# Patient Record
Sex: Female | Born: 1935 | Race: White | Hispanic: No | Smoking: Never smoker
Health system: Southern US, Community
[De-identification: ages and names within clinical notes are randomized; demographics above are authoritative.]

## PROBLEM LIST (undated history)

## (undated) DIAGNOSIS — E876 Hypokalemia: Secondary | ICD-10-CM

## (undated) DIAGNOSIS — E0590 Thyrotoxicosis, unspecified without thyrotoxic crisis or storm: Secondary | ICD-10-CM

## (undated) DIAGNOSIS — M1990 Unspecified osteoarthritis, unspecified site: Secondary | ICD-10-CM

## (undated) DIAGNOSIS — I10 Essential (primary) hypertension: Secondary | ICD-10-CM

## (undated) DIAGNOSIS — D649 Anemia, unspecified: Secondary | ICD-10-CM

## (undated) DIAGNOSIS — D691 Qualitative platelet defects: Secondary | ICD-10-CM

## (undated) DIAGNOSIS — N200 Calculus of kidney: Secondary | ICD-10-CM

## (undated) DIAGNOSIS — J90 Pleural effusion, not elsewhere classified: Secondary | ICD-10-CM

## (undated) DIAGNOSIS — K5790 Diverticulosis of intestine, part unspecified, without perforation or abscess without bleeding: Secondary | ICD-10-CM

## (undated) DIAGNOSIS — I4891 Unspecified atrial fibrillation: Secondary | ICD-10-CM

## (undated) DIAGNOSIS — E119 Type 2 diabetes mellitus without complications: Secondary | ICD-10-CM

## (undated) DIAGNOSIS — I509 Heart failure, unspecified: Secondary | ICD-10-CM

## (undated) HISTORY — PX: CHOLECYSTECTOMY: SHX55

## (undated) HISTORY — DX: Unspecified osteoarthritis, unspecified site: M19.90

## (undated) HISTORY — DX: Calculus of kidney: N20.0

## (undated) HISTORY — DX: Diverticulosis of intestine, part unspecified, without perforation or abscess without bleeding: K57.90

## (undated) HISTORY — DX: Hypokalemia: E87.6

## (undated) HISTORY — DX: Unspecified atrial fibrillation: I48.91

## (undated) HISTORY — DX: Type 2 diabetes mellitus without complications: E11.9

## (undated) HISTORY — DX: Essential (primary) hypertension: I10

## (undated) HISTORY — DX: Qualitative platelet defects: D69.1

## (undated) HISTORY — DX: Anemia, unspecified: D64.9

## (undated) HISTORY — DX: Heart failure, unspecified: I50.9

## (undated) HISTORY — DX: Pleural effusion, not elsewhere classified: J90

## (undated) HISTORY — DX: Thyrotoxicosis, unspecified without thyrotoxic crisis or storm: E05.90

---

## 2000-03-23 ENCOUNTER — Encounter: Payer: Self-pay | Admitting: Emergency Medicine

## 2000-03-23 ENCOUNTER — Inpatient Hospital Stay (HOSPITAL_COMMUNITY): Admission: EM | Admit: 2000-03-23 | Discharge: 2000-03-24 | Payer: Self-pay | Admitting: Emergency Medicine

## 2011-03-01 DIAGNOSIS — E0520 Thyrotoxicosis with toxic multinodular goiter without thyrotoxic crisis or storm: Secondary | ICD-10-CM | POA: Diagnosis not present

## 2011-03-02 DIAGNOSIS — G4700 Insomnia, unspecified: Secondary | ICD-10-CM | POA: Diagnosis not present

## 2011-03-02 DIAGNOSIS — M949 Disorder of cartilage, unspecified: Secondary | ICD-10-CM | POA: Diagnosis not present

## 2011-03-02 DIAGNOSIS — E119 Type 2 diabetes mellitus without complications: Secondary | ICD-10-CM | POA: Diagnosis not present

## 2011-03-02 DIAGNOSIS — J3089 Other allergic rhinitis: Secondary | ICD-10-CM | POA: Diagnosis not present

## 2011-03-02 DIAGNOSIS — I10 Essential (primary) hypertension: Secondary | ICD-10-CM | POA: Diagnosis not present

## 2011-03-02 DIAGNOSIS — M899 Disorder of bone, unspecified: Secondary | ICD-10-CM | POA: Diagnosis not present

## 2011-03-09 DIAGNOSIS — E119 Type 2 diabetes mellitus without complications: Secondary | ICD-10-CM | POA: Diagnosis not present

## 2011-03-09 DIAGNOSIS — E7800 Pure hypercholesterolemia, unspecified: Secondary | ICD-10-CM | POA: Diagnosis not present

## 2011-03-09 DIAGNOSIS — M949 Disorder of cartilage, unspecified: Secondary | ICD-10-CM | POA: Diagnosis not present

## 2011-03-09 DIAGNOSIS — M899 Disorder of bone, unspecified: Secondary | ICD-10-CM | POA: Diagnosis not present

## 2011-03-15 DIAGNOSIS — E119 Type 2 diabetes mellitus without complications: Secondary | ICD-10-CM | POA: Diagnosis not present

## 2011-03-15 DIAGNOSIS — E7800 Pure hypercholesterolemia, unspecified: Secondary | ICD-10-CM | POA: Diagnosis not present

## 2011-03-15 DIAGNOSIS — I10 Essential (primary) hypertension: Secondary | ICD-10-CM | POA: Diagnosis not present

## 2011-05-20 DIAGNOSIS — L570 Actinic keratosis: Secondary | ICD-10-CM | POA: Diagnosis not present

## 2011-05-20 DIAGNOSIS — D485 Neoplasm of uncertain behavior of skin: Secondary | ICD-10-CM | POA: Diagnosis not present

## 2011-06-08 DIAGNOSIS — C44319 Basal cell carcinoma of skin of other parts of face: Secondary | ICD-10-CM | POA: Diagnosis not present

## 2011-08-30 DIAGNOSIS — E0520 Thyrotoxicosis with toxic multinodular goiter without thyrotoxic crisis or storm: Secondary | ICD-10-CM | POA: Diagnosis not present

## 2011-09-13 DIAGNOSIS — M25519 Pain in unspecified shoulder: Secondary | ICD-10-CM | POA: Diagnosis not present

## 2011-09-23 DIAGNOSIS — E782 Mixed hyperlipidemia: Secondary | ICD-10-CM | POA: Diagnosis not present

## 2011-09-23 DIAGNOSIS — I10 Essential (primary) hypertension: Secondary | ICD-10-CM | POA: Diagnosis not present

## 2011-09-23 DIAGNOSIS — E119 Type 2 diabetes mellitus without complications: Secondary | ICD-10-CM | POA: Diagnosis not present

## 2011-09-27 DIAGNOSIS — M67919 Unspecified disorder of synovium and tendon, unspecified shoulder: Secondary | ICD-10-CM | POA: Diagnosis not present

## 2011-09-27 DIAGNOSIS — M719 Bursopathy, unspecified: Secondary | ICD-10-CM | POA: Diagnosis not present

## 2011-09-27 DIAGNOSIS — M7530 Calcific tendinitis of unspecified shoulder: Secondary | ICD-10-CM | POA: Diagnosis not present

## 2011-09-27 DIAGNOSIS — M25519 Pain in unspecified shoulder: Secondary | ICD-10-CM | POA: Diagnosis not present

## 2011-10-19 DIAGNOSIS — Z23 Encounter for immunization: Secondary | ICD-10-CM | POA: Diagnosis not present

## 2011-11-15 DIAGNOSIS — M7530 Calcific tendinitis of unspecified shoulder: Secondary | ICD-10-CM | POA: Diagnosis not present

## 2011-11-15 DIAGNOSIS — M25519 Pain in unspecified shoulder: Secondary | ICD-10-CM | POA: Diagnosis not present

## 2011-11-15 DIAGNOSIS — M67919 Unspecified disorder of synovium and tendon, unspecified shoulder: Secondary | ICD-10-CM | POA: Diagnosis not present

## 2011-11-29 DIAGNOSIS — Z1231 Encounter for screening mammogram for malignant neoplasm of breast: Secondary | ICD-10-CM | POA: Diagnosis not present

## 2012-01-02 DIAGNOSIS — E119 Type 2 diabetes mellitus without complications: Secondary | ICD-10-CM | POA: Diagnosis not present

## 2012-01-20 DIAGNOSIS — M1710 Unilateral primary osteoarthritis, unspecified knee: Secondary | ICD-10-CM | POA: Diagnosis not present

## 2012-01-20 DIAGNOSIS — M25569 Pain in unspecified knee: Secondary | ICD-10-CM | POA: Diagnosis not present

## 2012-01-21 DIAGNOSIS — J309 Allergic rhinitis, unspecified: Secondary | ICD-10-CM | POA: Diagnosis not present

## 2012-01-27 DIAGNOSIS — Z79899 Other long term (current) drug therapy: Secondary | ICD-10-CM | POA: Diagnosis not present

## 2012-01-27 DIAGNOSIS — G4700 Insomnia, unspecified: Secondary | ICD-10-CM | POA: Diagnosis not present

## 2012-01-27 DIAGNOSIS — I10 Essential (primary) hypertension: Secondary | ICD-10-CM | POA: Diagnosis not present

## 2012-01-27 DIAGNOSIS — F411 Generalized anxiety disorder: Secondary | ICD-10-CM | POA: Diagnosis not present

## 2012-03-02 DIAGNOSIS — E0520 Thyrotoxicosis with toxic multinodular goiter without thyrotoxic crisis or storm: Secondary | ICD-10-CM | POA: Diagnosis not present

## 2012-06-11 DIAGNOSIS — M67919 Unspecified disorder of synovium and tendon, unspecified shoulder: Secondary | ICD-10-CM | POA: Diagnosis not present

## 2012-06-11 DIAGNOSIS — M719 Bursopathy, unspecified: Secondary | ICD-10-CM | POA: Diagnosis not present

## 2012-06-11 DIAGNOSIS — M7530 Calcific tendinitis of unspecified shoulder: Secondary | ICD-10-CM | POA: Diagnosis not present

## 2012-06-11 DIAGNOSIS — M25519 Pain in unspecified shoulder: Secondary | ICD-10-CM | POA: Diagnosis not present

## 2012-06-22 DIAGNOSIS — J309 Allergic rhinitis, unspecified: Secondary | ICD-10-CM | POA: Diagnosis not present

## 2012-07-23 DIAGNOSIS — I10 Essential (primary) hypertension: Secondary | ICD-10-CM | POA: Diagnosis not present

## 2012-07-23 DIAGNOSIS — E119 Type 2 diabetes mellitus without complications: Secondary | ICD-10-CM | POA: Diagnosis not present

## 2012-07-23 DIAGNOSIS — M899 Disorder of bone, unspecified: Secondary | ICD-10-CM | POA: Diagnosis not present

## 2012-07-23 DIAGNOSIS — Z006 Encounter for examination for normal comparison and control in clinical research program: Secondary | ICD-10-CM | POA: Diagnosis not present

## 2012-08-28 DIAGNOSIS — E0520 Thyrotoxicosis with toxic multinodular goiter without thyrotoxic crisis or storm: Secondary | ICD-10-CM | POA: Diagnosis not present

## 2012-08-28 DIAGNOSIS — E0590 Thyrotoxicosis, unspecified without thyrotoxic crisis or storm: Secondary | ICD-10-CM | POA: Diagnosis not present

## 2012-09-27 DIAGNOSIS — R937 Abnormal findings on diagnostic imaging of other parts of musculoskeletal system: Secondary | ICD-10-CM | POA: Diagnosis not present

## 2012-09-27 DIAGNOSIS — I10 Essential (primary) hypertension: Secondary | ICD-10-CM | POA: Diagnosis not present

## 2012-09-27 DIAGNOSIS — R609 Edema, unspecified: Secondary | ICD-10-CM | POA: Diagnosis not present

## 2012-10-31 DIAGNOSIS — Z23 Encounter for immunization: Secondary | ICD-10-CM | POA: Diagnosis not present

## 2013-02-21 DIAGNOSIS — IMO0001 Reserved for inherently not codable concepts without codable children: Secondary | ICD-10-CM | POA: Diagnosis not present

## 2013-02-21 DIAGNOSIS — I10 Essential (primary) hypertension: Secondary | ICD-10-CM | POA: Diagnosis not present

## 2013-02-21 DIAGNOSIS — M899 Disorder of bone, unspecified: Secondary | ICD-10-CM | POA: Diagnosis not present

## 2013-02-21 DIAGNOSIS — E559 Vitamin D deficiency, unspecified: Secondary | ICD-10-CM | POA: Diagnosis not present

## 2013-02-21 DIAGNOSIS — M1990 Unspecified osteoarthritis, unspecified site: Secondary | ICD-10-CM | POA: Diagnosis not present

## 2013-02-21 DIAGNOSIS — M949 Disorder of cartilage, unspecified: Secondary | ICD-10-CM | POA: Diagnosis not present

## 2013-05-13 DIAGNOSIS — E119 Type 2 diabetes mellitus without complications: Secondary | ICD-10-CM | POA: Diagnosis not present

## 2013-05-13 DIAGNOSIS — I10 Essential (primary) hypertension: Secondary | ICD-10-CM | POA: Diagnosis not present

## 2013-05-13 DIAGNOSIS — Z0000 Encounter for general adult medical examination without abnormal findings: Secondary | ICD-10-CM | POA: Diagnosis not present

## 2013-05-13 DIAGNOSIS — F411 Generalized anxiety disorder: Secondary | ICD-10-CM | POA: Diagnosis not present

## 2013-05-28 DIAGNOSIS — Z1231 Encounter for screening mammogram for malignant neoplasm of breast: Secondary | ICD-10-CM | POA: Diagnosis not present

## 2013-06-20 DIAGNOSIS — C44319 Basal cell carcinoma of skin of other parts of face: Secondary | ICD-10-CM | POA: Diagnosis not present

## 2013-06-26 DIAGNOSIS — C44319 Basal cell carcinoma of skin of other parts of face: Secondary | ICD-10-CM | POA: Diagnosis not present

## 2013-07-06 DIAGNOSIS — L570 Actinic keratosis: Secondary | ICD-10-CM | POA: Diagnosis not present

## 2013-07-11 DIAGNOSIS — E119 Type 2 diabetes mellitus without complications: Secondary | ICD-10-CM | POA: Diagnosis not present

## 2013-07-25 DIAGNOSIS — C4441 Basal cell carcinoma of skin of scalp and neck: Secondary | ICD-10-CM | POA: Diagnosis not present

## 2013-08-16 DIAGNOSIS — E0590 Thyrotoxicosis, unspecified without thyrotoxic crisis or storm: Secondary | ICD-10-CM | POA: Diagnosis not present

## 2013-11-08 DIAGNOSIS — Z23 Encounter for immunization: Secondary | ICD-10-CM | POA: Diagnosis not present

## 2014-03-18 DIAGNOSIS — Z23 Encounter for immunization: Secondary | ICD-10-CM | POA: Diagnosis not present

## 2014-03-18 DIAGNOSIS — E119 Type 2 diabetes mellitus without complications: Secondary | ICD-10-CM | POA: Diagnosis not present

## 2014-03-18 DIAGNOSIS — R5381 Other malaise: Secondary | ICD-10-CM | POA: Diagnosis not present

## 2014-03-18 DIAGNOSIS — I10 Essential (primary) hypertension: Secondary | ICD-10-CM | POA: Diagnosis not present

## 2014-03-18 DIAGNOSIS — D519 Vitamin B12 deficiency anemia, unspecified: Secondary | ICD-10-CM | POA: Diagnosis not present

## 2014-03-24 DIAGNOSIS — R5381 Other malaise: Secondary | ICD-10-CM | POA: Diagnosis not present

## 2014-03-31 DIAGNOSIS — E559 Vitamin D deficiency, unspecified: Secondary | ICD-10-CM | POA: Diagnosis not present

## 2014-04-07 DIAGNOSIS — D510 Vitamin B12 deficiency anemia due to intrinsic factor deficiency: Secondary | ICD-10-CM | POA: Diagnosis not present

## 2014-04-14 DIAGNOSIS — D510 Vitamin B12 deficiency anemia due to intrinsic factor deficiency: Secondary | ICD-10-CM | POA: Diagnosis not present

## 2014-06-04 DIAGNOSIS — D510 Vitamin B12 deficiency anemia due to intrinsic factor deficiency: Secondary | ICD-10-CM | POA: Diagnosis not present

## 2014-06-04 DIAGNOSIS — Z1231 Encounter for screening mammogram for malignant neoplasm of breast: Secondary | ICD-10-CM | POA: Diagnosis not present

## 2014-07-10 DIAGNOSIS — E119 Type 2 diabetes mellitus without complications: Secondary | ICD-10-CM | POA: Diagnosis not present

## 2014-07-17 DIAGNOSIS — I10 Essential (primary) hypertension: Secondary | ICD-10-CM | POA: Diagnosis not present

## 2014-07-17 DIAGNOSIS — E119 Type 2 diabetes mellitus without complications: Secondary | ICD-10-CM | POA: Diagnosis not present

## 2014-07-17 DIAGNOSIS — Z1389 Encounter for screening for other disorder: Secondary | ICD-10-CM | POA: Diagnosis not present

## 2014-07-17 DIAGNOSIS — Z9181 History of falling: Secondary | ICD-10-CM | POA: Diagnosis not present

## 2014-07-17 DIAGNOSIS — Z0000 Encounter for general adult medical examination without abnormal findings: Secondary | ICD-10-CM | POA: Diagnosis not present

## 2014-07-28 DIAGNOSIS — H2513 Age-related nuclear cataract, bilateral: Secondary | ICD-10-CM | POA: Diagnosis not present

## 2014-07-28 DIAGNOSIS — E119 Type 2 diabetes mellitus without complications: Secondary | ICD-10-CM | POA: Diagnosis not present

## 2014-07-31 DIAGNOSIS — E0520 Thyrotoxicosis with toxic multinodular goiter without thyrotoxic crisis or storm: Secondary | ICD-10-CM | POA: Diagnosis not present

## 2014-07-31 DIAGNOSIS — E0590 Thyrotoxicosis, unspecified without thyrotoxic crisis or storm: Secondary | ICD-10-CM | POA: Diagnosis not present

## 2014-10-24 DIAGNOSIS — Z23 Encounter for immunization: Secondary | ICD-10-CM | POA: Diagnosis not present

## 2014-12-08 DIAGNOSIS — M1990 Unspecified osteoarthritis, unspecified site: Secondary | ICD-10-CM | POA: Diagnosis not present

## 2014-12-15 DIAGNOSIS — M1990 Unspecified osteoarthritis, unspecified site: Secondary | ICD-10-CM | POA: Diagnosis not present

## 2014-12-15 DIAGNOSIS — E1143 Type 2 diabetes mellitus with diabetic autonomic (poly)neuropathy: Secondary | ICD-10-CM | POA: Diagnosis not present

## 2014-12-15 DIAGNOSIS — I10 Essential (primary) hypertension: Secondary | ICD-10-CM | POA: Diagnosis not present

## 2014-12-15 DIAGNOSIS — R600 Localized edema: Secondary | ICD-10-CM | POA: Diagnosis not present

## 2015-02-05 DIAGNOSIS — Z1389 Encounter for screening for other disorder: Secondary | ICD-10-CM | POA: Diagnosis not present

## 2015-02-05 DIAGNOSIS — S93401A Sprain of unspecified ligament of right ankle, initial encounter: Secondary | ICD-10-CM | POA: Diagnosis not present

## 2015-02-05 DIAGNOSIS — H2513 Age-related nuclear cataract, bilateral: Secondary | ICD-10-CM | POA: Diagnosis not present

## 2015-02-05 DIAGNOSIS — E119 Type 2 diabetes mellitus without complications: Secondary | ICD-10-CM | POA: Diagnosis not present

## 2015-02-05 DIAGNOSIS — F419 Anxiety disorder, unspecified: Secondary | ICD-10-CM | POA: Diagnosis not present

## 2015-04-10 DIAGNOSIS — M1990 Unspecified osteoarthritis, unspecified site: Secondary | ICD-10-CM | POA: Diagnosis not present

## 2015-04-10 DIAGNOSIS — E1143 Type 2 diabetes mellitus with diabetic autonomic (poly)neuropathy: Secondary | ICD-10-CM | POA: Diagnosis not present

## 2015-04-10 DIAGNOSIS — I10 Essential (primary) hypertension: Secondary | ICD-10-CM | POA: Diagnosis not present

## 2015-04-10 DIAGNOSIS — G4700 Insomnia, unspecified: Secondary | ICD-10-CM | POA: Diagnosis not present

## 2015-07-03 DIAGNOSIS — E119 Type 2 diabetes mellitus without complications: Secondary | ICD-10-CM | POA: Diagnosis not present

## 2015-07-21 DIAGNOSIS — G252 Other specified forms of tremor: Secondary | ICD-10-CM | POA: Diagnosis not present

## 2015-07-21 DIAGNOSIS — Z0000 Encounter for general adult medical examination without abnormal findings: Secondary | ICD-10-CM | POA: Diagnosis not present

## 2015-07-21 DIAGNOSIS — E1165 Type 2 diabetes mellitus with hyperglycemia: Secondary | ICD-10-CM | POA: Diagnosis not present

## 2015-07-21 DIAGNOSIS — D72829 Elevated white blood cell count, unspecified: Secondary | ICD-10-CM | POA: Diagnosis not present

## 2015-07-21 DIAGNOSIS — F419 Anxiety disorder, unspecified: Secondary | ICD-10-CM | POA: Diagnosis not present

## 2015-07-31 DIAGNOSIS — D696 Thrombocytopenia, unspecified: Secondary | ICD-10-CM | POA: Diagnosis not present

## 2015-07-31 DIAGNOSIS — Z1231 Encounter for screening mammogram for malignant neoplasm of breast: Secondary | ICD-10-CM | POA: Diagnosis not present

## 2015-07-31 DIAGNOSIS — D72829 Elevated white blood cell count, unspecified: Secondary | ICD-10-CM | POA: Diagnosis not present

## 2015-08-13 DIAGNOSIS — E119 Type 2 diabetes mellitus without complications: Secondary | ICD-10-CM | POA: Diagnosis not present

## 2015-08-13 DIAGNOSIS — H2513 Age-related nuclear cataract, bilateral: Secondary | ICD-10-CM | POA: Diagnosis not present

## 2015-09-30 DIAGNOSIS — D473 Essential (hemorrhagic) thrombocythemia: Secondary | ICD-10-CM | POA: Diagnosis not present

## 2015-11-13 DIAGNOSIS — E119 Type 2 diabetes mellitus without complications: Secondary | ICD-10-CM | POA: Diagnosis not present

## 2015-11-13 DIAGNOSIS — Z23 Encounter for immunization: Secondary | ICD-10-CM | POA: Diagnosis not present

## 2015-11-20 DIAGNOSIS — E1143 Type 2 diabetes mellitus with diabetic autonomic (poly)neuropathy: Secondary | ICD-10-CM | POA: Diagnosis not present

## 2015-11-20 DIAGNOSIS — Z1389 Encounter for screening for other disorder: Secondary | ICD-10-CM | POA: Diagnosis not present

## 2015-11-20 DIAGNOSIS — I10 Essential (primary) hypertension: Secondary | ICD-10-CM | POA: Diagnosis not present

## 2015-11-20 DIAGNOSIS — Z9181 History of falling: Secondary | ICD-10-CM | POA: Diagnosis not present

## 2016-01-27 DIAGNOSIS — D473 Essential (hemorrhagic) thrombocythemia: Secondary | ICD-10-CM | POA: Diagnosis not present

## 2016-02-16 DIAGNOSIS — E119 Type 2 diabetes mellitus without complications: Secondary | ICD-10-CM | POA: Diagnosis not present

## 2016-02-16 DIAGNOSIS — H2513 Age-related nuclear cataract, bilateral: Secondary | ICD-10-CM | POA: Diagnosis not present

## 2016-03-15 DIAGNOSIS — I10 Essential (primary) hypertension: Secondary | ICD-10-CM | POA: Diagnosis not present

## 2016-03-15 DIAGNOSIS — M1990 Unspecified osteoarthritis, unspecified site: Secondary | ICD-10-CM | POA: Diagnosis not present

## 2016-03-15 DIAGNOSIS — E1143 Type 2 diabetes mellitus with diabetic autonomic (poly)neuropathy: Secondary | ICD-10-CM | POA: Diagnosis not present

## 2016-03-15 DIAGNOSIS — Z1389 Encounter for screening for other disorder: Secondary | ICD-10-CM | POA: Diagnosis not present

## 2016-05-20 DIAGNOSIS — K921 Melena: Secondary | ICD-10-CM | POA: Diagnosis not present

## 2016-05-20 DIAGNOSIS — Z6822 Body mass index (BMI) 22.0-22.9, adult: Secondary | ICD-10-CM | POA: Diagnosis not present

## 2016-05-25 DIAGNOSIS — K921 Melena: Secondary | ICD-10-CM | POA: Diagnosis not present

## 2016-05-25 DIAGNOSIS — K648 Other hemorrhoids: Secondary | ICD-10-CM | POA: Diagnosis not present

## 2016-05-25 DIAGNOSIS — K5730 Diverticulosis of large intestine without perforation or abscess without bleeding: Secondary | ICD-10-CM | POA: Diagnosis not present

## 2016-05-31 DIAGNOSIS — D649 Anemia, unspecified: Secondary | ICD-10-CM | POA: Diagnosis not present

## 2016-06-02 DIAGNOSIS — K921 Melena: Secondary | ICD-10-CM | POA: Diagnosis not present

## 2016-06-02 DIAGNOSIS — K5730 Diverticulosis of large intestine without perforation or abscess without bleeding: Secondary | ICD-10-CM | POA: Diagnosis not present

## 2016-06-03 DIAGNOSIS — K3189 Other diseases of stomach and duodenum: Secondary | ICD-10-CM | POA: Diagnosis not present

## 2016-06-03 DIAGNOSIS — I10 Essential (primary) hypertension: Secondary | ICD-10-CM | POA: Diagnosis not present

## 2016-06-03 DIAGNOSIS — Z79899 Other long term (current) drug therapy: Secondary | ICD-10-CM | POA: Diagnosis not present

## 2016-06-03 DIAGNOSIS — R634 Abnormal weight loss: Secondary | ICD-10-CM | POA: Diagnosis not present

## 2016-06-03 DIAGNOSIS — D619 Aplastic anemia, unspecified: Secondary | ICD-10-CM | POA: Diagnosis not present

## 2016-06-03 DIAGNOSIS — D500 Iron deficiency anemia secondary to blood loss (chronic): Secondary | ICD-10-CM | POA: Diagnosis not present

## 2016-06-03 DIAGNOSIS — Z9049 Acquired absence of other specified parts of digestive tract: Secondary | ICD-10-CM | POA: Diagnosis not present

## 2016-06-03 DIAGNOSIS — E7800 Pure hypercholesterolemia, unspecified: Secondary | ICD-10-CM | POA: Diagnosis not present

## 2016-06-03 DIAGNOSIS — Z7982 Long term (current) use of aspirin: Secondary | ICD-10-CM | POA: Diagnosis not present

## 2016-06-03 DIAGNOSIS — Z86010 Personal history of colonic polyps: Secondary | ICD-10-CM | POA: Diagnosis not present

## 2016-06-03 DIAGNOSIS — E119 Type 2 diabetes mellitus without complications: Secondary | ICD-10-CM | POA: Diagnosis not present

## 2016-06-03 DIAGNOSIS — E079 Disorder of thyroid, unspecified: Secondary | ICD-10-CM | POA: Diagnosis not present

## 2016-06-03 DIAGNOSIS — M1990 Unspecified osteoarthritis, unspecified site: Secondary | ICD-10-CM | POA: Diagnosis not present

## 2016-06-03 DIAGNOSIS — D509 Iron deficiency anemia, unspecified: Secondary | ICD-10-CM | POA: Diagnosis not present

## 2016-06-03 DIAGNOSIS — R195 Other fecal abnormalities: Secondary | ICD-10-CM | POA: Diagnosis not present

## 2016-06-03 DIAGNOSIS — Z90710 Acquired absence of both cervix and uterus: Secondary | ICD-10-CM | POA: Diagnosis not present

## 2016-06-08 DIAGNOSIS — R000 Tachycardia, unspecified: Secondary | ICD-10-CM | POA: Diagnosis not present

## 2016-06-08 DIAGNOSIS — D649 Anemia, unspecified: Secondary | ICD-10-CM | POA: Diagnosis not present

## 2016-06-14 DIAGNOSIS — K648 Other hemorrhoids: Secondary | ICD-10-CM | POA: Diagnosis not present

## 2016-06-14 DIAGNOSIS — Z79899 Other long term (current) drug therapy: Secondary | ICD-10-CM | POA: Diagnosis not present

## 2016-06-14 DIAGNOSIS — K5730 Diverticulosis of large intestine without perforation or abscess without bleeding: Secondary | ICD-10-CM | POA: Diagnosis not present

## 2016-06-14 DIAGNOSIS — Z86010 Personal history of colonic polyps: Secondary | ICD-10-CM | POA: Diagnosis not present

## 2016-06-14 DIAGNOSIS — Z90710 Acquired absence of both cervix and uterus: Secondary | ICD-10-CM | POA: Diagnosis not present

## 2016-06-14 DIAGNOSIS — Z9049 Acquired absence of other specified parts of digestive tract: Secondary | ICD-10-CM | POA: Diagnosis not present

## 2016-06-14 DIAGNOSIS — D509 Iron deficiency anemia, unspecified: Secondary | ICD-10-CM | POA: Diagnosis not present

## 2016-06-14 DIAGNOSIS — K921 Melena: Secondary | ICD-10-CM | POA: Diagnosis not present

## 2016-06-14 DIAGNOSIS — I10 Essential (primary) hypertension: Secondary | ICD-10-CM | POA: Diagnosis not present

## 2016-06-14 DIAGNOSIS — Z7982 Long term (current) use of aspirin: Secondary | ICD-10-CM | POA: Diagnosis not present

## 2016-06-14 DIAGNOSIS — E039 Hypothyroidism, unspecified: Secondary | ICD-10-CM | POA: Diagnosis not present

## 2016-06-14 DIAGNOSIS — E119 Type 2 diabetes mellitus without complications: Secondary | ICD-10-CM | POA: Diagnosis not present

## 2016-06-15 DIAGNOSIS — D649 Anemia, unspecified: Secondary | ICD-10-CM | POA: Diagnosis not present

## 2016-06-22 DIAGNOSIS — D649 Anemia, unspecified: Secondary | ICD-10-CM | POA: Diagnosis not present

## 2016-07-06 DIAGNOSIS — D649 Anemia, unspecified: Secondary | ICD-10-CM | POA: Diagnosis not present

## 2016-07-13 DIAGNOSIS — K5730 Diverticulosis of large intestine without perforation or abscess without bleeding: Secondary | ICD-10-CM | POA: Diagnosis not present

## 2016-07-13 DIAGNOSIS — K921 Melena: Secondary | ICD-10-CM | POA: Diagnosis not present

## 2016-07-13 DIAGNOSIS — D509 Iron deficiency anemia, unspecified: Secondary | ICD-10-CM | POA: Diagnosis not present

## 2016-07-27 DIAGNOSIS — D72829 Elevated white blood cell count, unspecified: Secondary | ICD-10-CM | POA: Diagnosis not present

## 2016-07-27 DIAGNOSIS — Z7982 Long term (current) use of aspirin: Secondary | ICD-10-CM | POA: Diagnosis not present

## 2016-07-27 DIAGNOSIS — D473 Essential (hemorrhagic) thrombocythemia: Secondary | ICD-10-CM | POA: Diagnosis not present

## 2016-08-15 DIAGNOSIS — H2513 Age-related nuclear cataract, bilateral: Secondary | ICD-10-CM | POA: Diagnosis not present

## 2016-08-15 DIAGNOSIS — E119 Type 2 diabetes mellitus without complications: Secondary | ICD-10-CM | POA: Diagnosis not present

## 2016-11-02 DIAGNOSIS — S81802A Unspecified open wound, left lower leg, initial encounter: Secondary | ICD-10-CM | POA: Diagnosis not present

## 2016-11-03 DIAGNOSIS — E1143 Type 2 diabetes mellitus with diabetic autonomic (poly)neuropathy: Secondary | ICD-10-CM | POA: Diagnosis not present

## 2016-11-03 DIAGNOSIS — Z0000 Encounter for general adult medical examination without abnormal findings: Secondary | ICD-10-CM | POA: Diagnosis not present

## 2016-11-03 DIAGNOSIS — Z6822 Body mass index (BMI) 22.0-22.9, adult: Secondary | ICD-10-CM | POA: Diagnosis not present

## 2016-11-03 DIAGNOSIS — R5383 Other fatigue: Secondary | ICD-10-CM | POA: Diagnosis not present

## 2016-11-03 DIAGNOSIS — Z23 Encounter for immunization: Secondary | ICD-10-CM | POA: Diagnosis not present

## 2016-11-03 DIAGNOSIS — E0590 Thyrotoxicosis, unspecified without thyrotoxic crisis or storm: Secondary | ICD-10-CM | POA: Diagnosis not present

## 2016-11-03 DIAGNOSIS — D510 Vitamin B12 deficiency anemia due to intrinsic factor deficiency: Secondary | ICD-10-CM | POA: Diagnosis not present

## 2016-11-03 DIAGNOSIS — E611 Iron deficiency: Secondary | ICD-10-CM | POA: Diagnosis not present

## 2016-11-03 DIAGNOSIS — S81809A Unspecified open wound, unspecified lower leg, initial encounter: Secondary | ICD-10-CM | POA: Diagnosis not present

## 2016-11-09 DIAGNOSIS — E119 Type 2 diabetes mellitus without complications: Secondary | ICD-10-CM | POA: Diagnosis not present

## 2016-11-09 DIAGNOSIS — S8012XA Contusion of left lower leg, initial encounter: Secondary | ICD-10-CM | POA: Diagnosis not present

## 2016-11-09 DIAGNOSIS — S81802A Unspecified open wound, left lower leg, initial encounter: Secondary | ICD-10-CM | POA: Diagnosis not present

## 2016-11-09 DIAGNOSIS — M1990 Unspecified osteoarthritis, unspecified site: Secondary | ICD-10-CM | POA: Diagnosis not present

## 2016-11-09 DIAGNOSIS — I10 Essential (primary) hypertension: Secondary | ICD-10-CM | POA: Diagnosis not present

## 2016-11-16 DIAGNOSIS — S81802A Unspecified open wound, left lower leg, initial encounter: Secondary | ICD-10-CM | POA: Diagnosis not present

## 2016-11-16 DIAGNOSIS — S81812A Laceration without foreign body, left lower leg, initial encounter: Secondary | ICD-10-CM | POA: Diagnosis not present

## 2016-11-18 DIAGNOSIS — S81812A Laceration without foreign body, left lower leg, initial encounter: Secondary | ICD-10-CM | POA: Diagnosis not present

## 2016-11-18 DIAGNOSIS — S81802A Unspecified open wound, left lower leg, initial encounter: Secondary | ICD-10-CM | POA: Diagnosis not present

## 2016-11-21 DIAGNOSIS — S81802A Unspecified open wound, left lower leg, initial encounter: Secondary | ICD-10-CM | POA: Diagnosis not present

## 2016-11-23 DIAGNOSIS — S81802A Unspecified open wound, left lower leg, initial encounter: Secondary | ICD-10-CM | POA: Diagnosis not present

## 2016-11-25 DIAGNOSIS — T8189XA Other complications of procedures, not elsewhere classified, initial encounter: Secondary | ICD-10-CM | POA: Diagnosis not present

## 2016-11-25 DIAGNOSIS — S81802A Unspecified open wound, left lower leg, initial encounter: Secondary | ICD-10-CM | POA: Diagnosis not present

## 2016-11-28 DIAGNOSIS — S81802A Unspecified open wound, left lower leg, initial encounter: Secondary | ICD-10-CM | POA: Diagnosis not present

## 2016-11-30 DIAGNOSIS — S81802D Unspecified open wound, left lower leg, subsequent encounter: Secondary | ICD-10-CM | POA: Diagnosis not present

## 2016-12-01 DIAGNOSIS — I214 Non-ST elevation (NSTEMI) myocardial infarction: Secondary | ICD-10-CM | POA: Diagnosis not present

## 2016-12-01 DIAGNOSIS — R069 Unspecified abnormalities of breathing: Secondary | ICD-10-CM | POA: Diagnosis not present

## 2016-12-01 DIAGNOSIS — M1990 Unspecified osteoarthritis, unspecified site: Secondary | ICD-10-CM | POA: Diagnosis present

## 2016-12-01 DIAGNOSIS — R0602 Shortness of breath: Secondary | ICD-10-CM | POA: Diagnosis not present

## 2016-12-01 DIAGNOSIS — I5021 Acute systolic (congestive) heart failure: Secondary | ICD-10-CM | POA: Diagnosis not present

## 2016-12-01 DIAGNOSIS — I5023 Acute on chronic systolic (congestive) heart failure: Secondary | ICD-10-CM | POA: Diagnosis not present

## 2016-12-01 DIAGNOSIS — Z79899 Other long term (current) drug therapy: Secondary | ICD-10-CM | POA: Diagnosis not present

## 2016-12-01 DIAGNOSIS — I4891 Unspecified atrial fibrillation: Secondary | ICD-10-CM

## 2016-12-01 DIAGNOSIS — R531 Weakness: Secondary | ICD-10-CM | POA: Diagnosis present

## 2016-12-01 DIAGNOSIS — S81802D Unspecified open wound, left lower leg, subsequent encounter: Secondary | ICD-10-CM | POA: Diagnosis not present

## 2016-12-01 DIAGNOSIS — E876 Hypokalemia: Secondary | ICD-10-CM

## 2016-12-01 DIAGNOSIS — I110 Hypertensive heart disease with heart failure: Secondary | ICD-10-CM | POA: Diagnosis not present

## 2016-12-01 DIAGNOSIS — J90 Pleural effusion, not elsewhere classified: Secondary | ICD-10-CM

## 2016-12-01 DIAGNOSIS — J9601 Acute respiratory failure with hypoxia: Secondary | ICD-10-CM

## 2016-12-01 DIAGNOSIS — E119 Type 2 diabetes mellitus without complications: Secondary | ICD-10-CM

## 2016-12-01 DIAGNOSIS — Z882 Allergy status to sulfonamides status: Secondary | ICD-10-CM | POA: Diagnosis not present

## 2016-12-01 DIAGNOSIS — S81802A Unspecified open wound, left lower leg, initial encounter: Secondary | ICD-10-CM | POA: Diagnosis present

## 2016-12-01 DIAGNOSIS — I509 Heart failure, unspecified: Secondary | ICD-10-CM

## 2016-12-01 DIAGNOSIS — J9691 Respiratory failure, unspecified with hypoxia: Secondary | ICD-10-CM | POA: Diagnosis not present

## 2016-12-01 DIAGNOSIS — Z7982 Long term (current) use of aspirin: Secondary | ICD-10-CM | POA: Diagnosis not present

## 2016-12-01 DIAGNOSIS — R279 Unspecified lack of coordination: Secondary | ICD-10-CM | POA: Diagnosis not present

## 2016-12-01 DIAGNOSIS — Z743 Need for continuous supervision: Secondary | ICD-10-CM | POA: Diagnosis not present

## 2016-12-01 DIAGNOSIS — I480 Paroxysmal atrial fibrillation: Secondary | ICD-10-CM | POA: Diagnosis not present

## 2016-12-04 DIAGNOSIS — I4891 Unspecified atrial fibrillation: Secondary | ICD-10-CM

## 2016-12-04 DIAGNOSIS — R0602 Shortness of breath: Secondary | ICD-10-CM

## 2016-12-04 DIAGNOSIS — J9601 Acute respiratory failure with hypoxia: Secondary | ICD-10-CM

## 2016-12-05 DIAGNOSIS — S8012XA Contusion of left lower leg, initial encounter: Secondary | ICD-10-CM | POA: Diagnosis not present

## 2016-12-05 DIAGNOSIS — E876 Hypokalemia: Secondary | ICD-10-CM | POA: Diagnosis not present

## 2016-12-05 DIAGNOSIS — S81802A Unspecified open wound, left lower leg, initial encounter: Secondary | ICD-10-CM | POA: Diagnosis not present

## 2016-12-05 DIAGNOSIS — I119 Hypertensive heart disease without heart failure: Secondary | ICD-10-CM | POA: Diagnosis not present

## 2016-12-05 DIAGNOSIS — J9601 Acute respiratory failure with hypoxia: Secondary | ICD-10-CM | POA: Diagnosis not present

## 2016-12-05 DIAGNOSIS — R262 Difficulty in walking, not elsewhere classified: Secondary | ICD-10-CM | POA: Diagnosis not present

## 2016-12-05 DIAGNOSIS — Z743 Need for continuous supervision: Secondary | ICD-10-CM | POA: Diagnosis not present

## 2016-12-05 DIAGNOSIS — T8189XA Other complications of procedures, not elsewhere classified, initial encounter: Secondary | ICD-10-CM | POA: Diagnosis not present

## 2016-12-05 DIAGNOSIS — R748 Abnormal levels of other serum enzymes: Secondary | ICD-10-CM | POA: Diagnosis not present

## 2016-12-05 DIAGNOSIS — S81802D Unspecified open wound, left lower leg, subsequent encounter: Secondary | ICD-10-CM | POA: Diagnosis not present

## 2016-12-05 DIAGNOSIS — R0602 Shortness of breath: Secondary | ICD-10-CM | POA: Diagnosis not present

## 2016-12-05 DIAGNOSIS — I509 Heart failure, unspecified: Secondary | ICD-10-CM | POA: Diagnosis not present

## 2016-12-05 DIAGNOSIS — Z48817 Encounter for surgical aftercare following surgery on the skin and subcutaneous tissue: Secondary | ICD-10-CM | POA: Diagnosis not present

## 2016-12-05 DIAGNOSIS — I5022 Chronic systolic (congestive) heart failure: Secondary | ICD-10-CM | POA: Diagnosis not present

## 2016-12-05 DIAGNOSIS — J90 Pleural effusion, not elsewhere classified: Secondary | ICD-10-CM | POA: Diagnosis not present

## 2016-12-05 DIAGNOSIS — I340 Nonrheumatic mitral (valve) insufficiency: Secondary | ICD-10-CM | POA: Diagnosis not present

## 2016-12-05 DIAGNOSIS — J9691 Respiratory failure, unspecified with hypoxia: Secondary | ICD-10-CM | POA: Diagnosis not present

## 2016-12-05 DIAGNOSIS — R279 Unspecified lack of coordination: Secondary | ICD-10-CM | POA: Diagnosis not present

## 2016-12-05 DIAGNOSIS — R05 Cough: Secondary | ICD-10-CM | POA: Diagnosis not present

## 2016-12-05 DIAGNOSIS — I214 Non-ST elevation (NSTEMI) myocardial infarction: Secondary | ICD-10-CM | POA: Diagnosis not present

## 2016-12-05 DIAGNOSIS — I5021 Acute systolic (congestive) heart failure: Secondary | ICD-10-CM | POA: Diagnosis not present

## 2016-12-05 DIAGNOSIS — Z7901 Long term (current) use of anticoagulants: Secondary | ICD-10-CM | POA: Diagnosis not present

## 2016-12-05 DIAGNOSIS — I480 Paroxysmal atrial fibrillation: Secondary | ICD-10-CM | POA: Diagnosis not present

## 2016-12-05 DIAGNOSIS — E119 Type 2 diabetes mellitus without complications: Secondary | ICD-10-CM | POA: Diagnosis not present

## 2016-12-05 DIAGNOSIS — I4891 Unspecified atrial fibrillation: Secondary | ICD-10-CM | POA: Diagnosis not present

## 2016-12-08 DIAGNOSIS — I509 Heart failure, unspecified: Secondary | ICD-10-CM | POA: Diagnosis not present

## 2016-12-08 DIAGNOSIS — I4891 Unspecified atrial fibrillation: Secondary | ICD-10-CM | POA: Diagnosis not present

## 2016-12-08 DIAGNOSIS — R262 Difficulty in walking, not elsewhere classified: Secondary | ICD-10-CM | POA: Diagnosis not present

## 2016-12-08 DIAGNOSIS — I119 Hypertensive heart disease without heart failure: Secondary | ICD-10-CM | POA: Diagnosis not present

## 2016-12-09 DIAGNOSIS — S81802D Unspecified open wound, left lower leg, subsequent encounter: Secondary | ICD-10-CM | POA: Diagnosis not present

## 2016-12-09 DIAGNOSIS — T8189XA Other complications of procedures, not elsewhere classified, initial encounter: Secondary | ICD-10-CM | POA: Diagnosis not present

## 2016-12-12 ENCOUNTER — Encounter: Payer: Self-pay | Admitting: Cardiology

## 2016-12-12 ENCOUNTER — Encounter: Payer: Self-pay | Admitting: *Deleted

## 2016-12-12 DIAGNOSIS — I480 Paroxysmal atrial fibrillation: Secondary | ICD-10-CM | POA: Insufficient documentation

## 2016-12-12 DIAGNOSIS — M1990 Unspecified osteoarthritis, unspecified site: Secondary | ICD-10-CM | POA: Insufficient documentation

## 2016-12-12 DIAGNOSIS — I5022 Chronic systolic (congestive) heart failure: Secondary | ICD-10-CM | POA: Insufficient documentation

## 2016-12-12 DIAGNOSIS — J90 Pleural effusion, not elsewhere classified: Secondary | ICD-10-CM | POA: Insufficient documentation

## 2016-12-12 DIAGNOSIS — E119 Type 2 diabetes mellitus without complications: Secondary | ICD-10-CM | POA: Insufficient documentation

## 2016-12-12 DIAGNOSIS — I110 Hypertensive heart disease with heart failure: Secondary | ICD-10-CM | POA: Insufficient documentation

## 2016-12-12 DIAGNOSIS — E876 Hypokalemia: Secondary | ICD-10-CM | POA: Insufficient documentation

## 2016-12-12 DIAGNOSIS — D691 Qualitative platelet defects: Secondary | ICD-10-CM | POA: Insufficient documentation

## 2016-12-12 DIAGNOSIS — E0590 Thyrotoxicosis, unspecified without thyrotoxic crisis or storm: Secondary | ICD-10-CM | POA: Insufficient documentation

## 2016-12-12 DIAGNOSIS — K5790 Diverticulosis of intestine, part unspecified, without perforation or abscess without bleeding: Secondary | ICD-10-CM | POA: Insufficient documentation

## 2016-12-12 DIAGNOSIS — D649 Anemia, unspecified: Secondary | ICD-10-CM | POA: Insufficient documentation

## 2016-12-12 DIAGNOSIS — N200 Calculus of kidney: Secondary | ICD-10-CM | POA: Insufficient documentation

## 2016-12-13 DIAGNOSIS — S8012XA Contusion of left lower leg, initial encounter: Secondary | ICD-10-CM | POA: Diagnosis not present

## 2016-12-13 DIAGNOSIS — T8189XA Other complications of procedures, not elsewhere classified, initial encounter: Secondary | ICD-10-CM | POA: Diagnosis not present

## 2016-12-13 DIAGNOSIS — Z48817 Encounter for surgical aftercare following surgery on the skin and subcutaneous tissue: Secondary | ICD-10-CM | POA: Diagnosis not present

## 2016-12-13 DIAGNOSIS — S81802A Unspecified open wound, left lower leg, initial encounter: Secondary | ICD-10-CM | POA: Diagnosis not present

## 2016-12-16 ENCOUNTER — Encounter: Payer: Self-pay | Admitting: Cardiology

## 2016-12-16 DIAGNOSIS — R778 Other specified abnormalities of plasma proteins: Secondary | ICD-10-CM | POA: Insufficient documentation

## 2016-12-16 DIAGNOSIS — R7989 Other specified abnormal findings of blood chemistry: Secondary | ICD-10-CM

## 2016-12-16 DIAGNOSIS — Z7901 Long term (current) use of anticoagulants: Secondary | ICD-10-CM | POA: Insufficient documentation

## 2016-12-16 DIAGNOSIS — I340 Nonrheumatic mitral (valve) insufficiency: Secondary | ICD-10-CM | POA: Insufficient documentation

## 2016-12-16 NOTE — Progress Notes (Signed)
Cardiology Office Note:    Date:  12/19/2016   ID:  Bridget Torres, DOB [birth year], MRN [id number]  PCP:  [name], MD  Cardiologist:  Shirlee More, MD   Referring MD: [name], MD  ASSESSMENT:    1. Chronic systolic heart failure (HCC)   2. Paroxysmal atrial fibrillation ([place])   3. Non-rheumatic mitral regurgitation   4. Elevated troponin   5. Chronic anticoagulation    PLAN:    In order of problems listed above:  1. Her heart failure is compensated she has no fluid overload to go ahead and reduce her diuretic dose by 50% and notes she is no longer on ACE inhibitor with worsening renal function.  She also reduce her beta-blocker dose by 50%. 2. She will initiate outpatient amiodarone I think this is safe in this situation 200 mg twice daily I will see her back in 1 week to try to avoid recurrent atrial fibrillation which has caused marked clinical deterioration. 3. Stable clinically no evidence of heart failure 4. Stable, clearly atrial fibrillation cause compromise with elevated troponin and abrupt decompensated heart failure and should be avoided if at all possible 5. She will continue renal dose reduced atrial fibrillation anticoagulation with Eliquis continue clopidogrel with elevated troponin and stop aspirin.  Next appointment one week   Medication Adjustments/Labs and Tests Ordered: Current medicines are reviewed at length with the patient today.  Concerns regarding medicines are outlined above.  No orders of the defined types were placed in this encounter.  No orders of the defined types were placed in this encounter.    No chief complaint on file.   History of Present Illness:    Bridget Torres is a 83 y.o. female with recent [place] admission with rapid AF CHADS2 vasc of 6 , troponin elevation  and decompensated heart failure who is being seen today for the evaluation of heart failure at the request of Bridget Larsen II, MD.Her troponin  was elevated , MI ? Type 2  and conservative treatment was chosen by her family. Overall she has done well in rehab except last week she developed today were heart rate was rapid she had respiratory distress her oxygen saturations were low and it spontaneously resolved.  Clinically she is back in atrial fibrillation with decompensated heart failure but did not have an EKG performed.  She was given extra Lasix and then had worsening renal function. She is due to return home tomorrow and her daughter is very concerned somewhat apprehensive about the level of care she will require she will be set up with home health if possible home oxygen for palliation and I am going to go ahead and start her on amiodarone because of profound clinical deterioration with atrial fibrillation in hospital as well as a skilled nursing facility.  Although not spelled out when I speak with the daughter she wants comfort palliative care and realizes that her mother is not a candidate for invasive cardiac procedures at this time.   Past Medical History:  Diagnosis Date  . Anemia   . Arthritis   . Atrial fibrillation with rapid ventricular response ([place])   . Congestive heart failure ([place])   . Diabetes mellitus ([place])   . Diverticulosis   . Hypertension   . Hyperthyroidism   . Hypokalemia   . Kidney stones   . Pleural effusion   . 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 CHOLECYSTECTOMY  Current Medications: No outpatient medications have been marked as taking for the 12/19/16 encounter (Office Visit) with Richardo Priest, MD.     Allergies:   Sulfa antibiotics   Social History   Socioeconomic History  . Marital status: Widowed    Spouse name: None  . Number of children: None  . Years of education: None  . Highest education level: None  Social Needs  . Financial resource strain: None  . Food insecurity - worry: None  . Food insecurity - inability: None  .  Transportation needs - medical: None  . Transportation needs - non-medical: None  Occupational History  . None  Tobacco Use  . Smoking status: Never Smoker  . Smokeless tobacco: Never Used  Substance and Sexual Activity  . Alcohol use: No    Frequency: Never  . Drug use: No  . Sexual activity: None  Other Topics Concern  . None  Social History Narrative  . None     Family History: The patient's family history includes Hypertension in her mother; Stroke in her mother.  ROS:   ROS Please see the history of present illness.     All other systems reviewed and are negative.  EKGs/Labs/Other Studies Reviewed:    The following studies were reviewed today:   EKG:  EKG is  ordered today.  The ekg ordered today demonstrates sinus rhythm APCs left anterior hemiblock Echo: dilated LV EF 35%, moderate MR, Moderate LAE, pleural effusion Recent Labs: BNP 3310, repeat 15000, last BMP: K 3.3, Cr 0.9 No results found for requested labs within last 8760 hours.  Recent Lipid Panel No results found for: CHOL, TRIG, HDL, CHOLHDL, VLDL, LDLCALC, LDLDIRECT  Physical Exam:    VS:  Ht 5\' 3"  (1.6 m)   Wt 116 lb (52.6 kg)   BMI 20.55 kg/m     Wt Readings from Last 3 Encounters:  12/19/16 116 lb (52.6 kg)     GEN: frail  in no acute distress HEENT: Normal NECK: No JVD; No carotid bruits LYMPHATICS: No lymphadenopathy CARDIAC: RRR, no murmurs, rubs, gallops RESPIRATORY:  Clear to auscultation without rales, wheezing or rhonchi  ABDOMEN: Soft, non-tender, non-distended MUSCULOSKELETAL:  No edema; No deformity  SKIN: Warm and dry NEUROLOGIC:  Alert and oriented x 3 PSYCHIATRIC:  Normal affect     Signed, Shirlee More, MD  12/19/2016 1:29 PM    [facility]

## 2016-12-19 ENCOUNTER — Ambulatory Visit (INDEPENDENT_AMBULATORY_CARE_PROVIDER_SITE_OTHER): Payer: Medicare Other | Admitting: Cardiology

## 2016-12-19 ENCOUNTER — Encounter: Payer: Self-pay | Admitting: Cardiology

## 2016-12-19 VITALS — BP 100/60 | HR 51 | Ht 63.0 in | Wt 116.0 lb

## 2016-12-19 DIAGNOSIS — I480 Paroxysmal atrial fibrillation: Secondary | ICD-10-CM | POA: Diagnosis not present

## 2016-12-19 DIAGNOSIS — R748 Abnormal levels of other serum enzymes: Secondary | ICD-10-CM | POA: Diagnosis not present

## 2016-12-19 DIAGNOSIS — R7989 Other specified abnormal findings of blood chemistry: Secondary | ICD-10-CM

## 2016-12-19 DIAGNOSIS — I340 Nonrheumatic mitral (valve) insufficiency: Secondary | ICD-10-CM

## 2016-12-19 DIAGNOSIS — Z7901 Long term (current) use of anticoagulants: Secondary | ICD-10-CM | POA: Diagnosis not present

## 2016-12-19 DIAGNOSIS — R778 Other specified abnormalities of plasma proteins: Secondary | ICD-10-CM

## 2016-12-19 DIAGNOSIS — I5022 Chronic systolic (congestive) heart failure: Secondary | ICD-10-CM | POA: Diagnosis not present

## 2016-12-19 MED ORDER — AMIODARONE HCL 200 MG PO TABS: 200.0000 mg | ORAL_TABLET | Freq: Every day | ORAL | 3 refills | Status: DC

## 2016-12-19 MED ORDER — FUROSEMIDE 40 MG PO TABS: 40.0000 mg | ORAL_TABLET | Freq: Every day | ORAL | 2 refills | Status: DC

## 2016-12-19 MED ORDER — PRAVASTATIN SODIUM 20 MG PO TABS: 20.0000 mg | ORAL_TABLET | Freq: Every evening | ORAL | 3 refills | Status: DC

## 2016-12-19 MED ORDER — METOPROLOL SUCCINATE ER 25 MG PO TB24: 25.0000 mg | ORAL_TABLET | Freq: Every day | ORAL | 3 refills | Status: DC

## 2016-12-19 MED ORDER — FUROSEMIDE 40 MG PO TABS: 40.0000 mg | ORAL_TABLET | Freq: Every day | ORAL | 3 refills | Status: DC

## 2016-12-19 NOTE — Patient Instructions (Addendum)
Medication Instructions:  Your physician has recommended you make the following change in your medication:  STOP rosuvastatin STOP maxzide STOP trazadone STOP aspirin  DECREASE metoprolol to 25 mg daily DECREASE furosemide to 40 mg daily  START pravastatin 20 mg daily START amiodarone 200 mg twice daily  Labwork: None  Testing/Procedures: You had an EKG today.  Follow-Up: Your physician recommends that you schedule a follow-up appointment in: 1 week.  Resume [facility] heart failure at [facility].   May need home oxygen as needed.  Any Other Special Instructions Will Be Listed Below (If Applicable).     If you need a refill on your cardiac medications before your next appointment, please call your pharmacy.

## 2016-12-20 ENCOUNTER — Other Ambulatory Visit: Payer: Self-pay

## 2016-12-20 MED ORDER — FUROSEMIDE 40 MG PO TABS: 40.0000 mg | ORAL_TABLET | Freq: Every day | ORAL | 3 refills | Status: AC

## 2016-12-21 DIAGNOSIS — T8189XA Other complications of procedures, not elsewhere classified, initial encounter: Secondary | ICD-10-CM | POA: Diagnosis not present

## 2016-12-21 DIAGNOSIS — I4891 Unspecified atrial fibrillation: Secondary | ICD-10-CM | POA: Diagnosis not present

## 2016-12-21 DIAGNOSIS — I2510 Atherosclerotic heart disease of native coronary artery without angina pectoris: Secondary | ICD-10-CM | POA: Diagnosis not present

## 2016-12-21 DIAGNOSIS — Z7902 Long term (current) use of antithrombotics/antiplatelets: Secondary | ICD-10-CM | POA: Diagnosis not present

## 2016-12-21 DIAGNOSIS — Z7901 Long term (current) use of anticoagulants: Secondary | ICD-10-CM | POA: Diagnosis not present

## 2016-12-21 DIAGNOSIS — I110 Hypertensive heart disease with heart failure: Secondary | ICD-10-CM | POA: Diagnosis not present

## 2016-12-21 DIAGNOSIS — F329 Major depressive disorder, single episode, unspecified: Secondary | ICD-10-CM | POA: Diagnosis not present

## 2016-12-21 DIAGNOSIS — M1991 Primary osteoarthritis, unspecified site: Secondary | ICD-10-CM | POA: Diagnosis not present

## 2016-12-21 DIAGNOSIS — S81802D Unspecified open wound, left lower leg, subsequent encounter: Secondary | ICD-10-CM | POA: Diagnosis not present

## 2016-12-21 DIAGNOSIS — E1151 Type 2 diabetes mellitus with diabetic peripheral angiopathy without gangrene: Secondary | ICD-10-CM | POA: Diagnosis not present

## 2016-12-21 DIAGNOSIS — Z7984 Long term (current) use of oral hypoglycemic drugs: Secondary | ICD-10-CM | POA: Diagnosis not present

## 2016-12-21 DIAGNOSIS — I214 Non-ST elevation (NSTEMI) myocardial infarction: Secondary | ICD-10-CM | POA: Diagnosis not present

## 2016-12-21 DIAGNOSIS — S81802A Unspecified open wound, left lower leg, initial encounter: Secondary | ICD-10-CM | POA: Diagnosis not present

## 2016-12-21 DIAGNOSIS — I5022 Chronic systolic (congestive) heart failure: Secondary | ICD-10-CM | POA: Diagnosis not present

## 2016-12-23 DIAGNOSIS — I5022 Chronic systolic (congestive) heart failure: Secondary | ICD-10-CM | POA: Diagnosis not present

## 2016-12-23 DIAGNOSIS — E1151 Type 2 diabetes mellitus with diabetic peripheral angiopathy without gangrene: Secondary | ICD-10-CM | POA: Diagnosis not present

## 2016-12-23 DIAGNOSIS — I214 Non-ST elevation (NSTEMI) myocardial infarction: Secondary | ICD-10-CM | POA: Diagnosis not present

## 2016-12-23 DIAGNOSIS — I110 Hypertensive heart disease with heart failure: Secondary | ICD-10-CM | POA: Diagnosis not present

## 2016-12-23 DIAGNOSIS — I4891 Unspecified atrial fibrillation: Secondary | ICD-10-CM | POA: Diagnosis not present

## 2016-12-23 DIAGNOSIS — S81802D Unspecified open wound, left lower leg, subsequent encounter: Secondary | ICD-10-CM | POA: Diagnosis not present

## 2016-12-26 DIAGNOSIS — S81802D Unspecified open wound, left lower leg, subsequent encounter: Secondary | ICD-10-CM | POA: Diagnosis not present

## 2016-12-26 DIAGNOSIS — I4891 Unspecified atrial fibrillation: Secondary | ICD-10-CM | POA: Diagnosis not present

## 2016-12-26 DIAGNOSIS — I110 Hypertensive heart disease with heart failure: Secondary | ICD-10-CM | POA: Diagnosis not present

## 2016-12-26 DIAGNOSIS — E1151 Type 2 diabetes mellitus with diabetic peripheral angiopathy without gangrene: Secondary | ICD-10-CM | POA: Diagnosis not present

## 2016-12-26 DIAGNOSIS — I214 Non-ST elevation (NSTEMI) myocardial infarction: Secondary | ICD-10-CM | POA: Diagnosis not present

## 2016-12-26 DIAGNOSIS — Z79899 Other long term (current) drug therapy: Secondary | ICD-10-CM | POA: Insufficient documentation

## 2016-12-26 DIAGNOSIS — I5022 Chronic systolic (congestive) heart failure: Secondary | ICD-10-CM | POA: Diagnosis not present

## 2016-12-26 NOTE — Progress Notes (Signed)
Cardiology Office Note:    Date:  12/27/2016   ID:  Bridget Torres, DOB 03-14-1925, MRN [id number]  PCP:  [name], MD  Cardiologist:  Shirlee More, MD    Referring MD: [name], MD    ASSESSMENT:    1. Paroxysmal atrial fibrillation (HCC)   2. On amiodarone therapy   3. Chronic anticoagulation   4. Chronic systolic heart failure (HCC)    PLAN:    In order of problems listed above:  1. Stable maintaining sinus rhythm will decrease the dose of her amiodarone her QT interval looks more to me like Q waves and long QT and will recheck renal function today her potassium is normal I will decrease her amiodarone to 100 mg/day she will continue her current anticoagulant and beta-blocker 2. Reduced dose of amiodarone 3. Stable continue current antiplatelet anticoagulant 4. Stable compensated continue diuretic and beta-blocker, at this time I do not think she would tolerate vasodilators with borderline blood pressure.   Next appointment: 3 months   Medication Adjustments/Labs and Tests Ordered: Current medicines are reviewed at length with the patient today.  Concerns regarding medicines are outlined above.  Orders Placed This Encounter  Procedures  . Basic Metabolic Panel (BMET)  . B Nat Peptide  . EKG 12-Lead   Meds ordered this encounter  Medications  . amiodarone (PACERONE) 200 MG tablet    Sig: Take 1 tablet (200 mg total) by mouth daily.    Dispense:  60 tablet    Refill:  3    Chief Complaint  Patient presents with  . Follow-up  . Atrial Fibrillation  . Congestive Heart Failure    History of Present Illness:    Bridget Torres is a 83 y.o. female with a hx of rapid AF CHADS2 vasc of 6 , troponin elevation  and decompensated heart failure last seen one week ago and initiated on amiodarone.. Compliance with diet, lifestyle and medications: Yes Fortunately has been no recurrence of atrial fibrillation shortness breath edema syncope or  chest pain.  She has transitioned home and now is confused and the family is concerned she may not be able to be cared for and may require assisted living or skilled nursing.  She has home health.  They verbally tell me that the visiting nurse questioned her antiplatelet anticoagulant beta-blocker and amiodarone and I address that issue. Past Medical History:  Diagnosis Date  . Anemia   . Arthritis   . Atrial fibrillation with rapid ventricular response ([place])   . Congestive heart failure ([place])   . Diabetes mellitus ([place])   . Diverticulosis   . Hypertension   . Hyperthyroidism   . Hypokalemia   . Kidney stones   . Pleural effusion   . 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 CHOLECYSTECTOMY      Current Medications: Current Meds  Medication Sig  . acetaminophen (TYLENOL) 500 MG tablet Take 1,000 mg 2 (two) times daily by mouth.  Marland Kitchen amiodarone (PACERONE) 200 MG tablet Take 1 tablet (200 mg total) by mouth daily.  Marland Kitchen apixaban (ELIQUIS) 2.5 MG TABS tablet Take 2.5 mg 2 (two) times daily by mouth.  . calcium-vitamin D (OSCAL WITH D) 500-200 MG-UNIT tablet Take 1 tablet 2 (two) times daily by mouth.  . clopidogrel (PLAVIX) 75 MG tablet Take 75 mg daily by mouth.  . DULoxetine (CYMBALTA) 60 MG capsule Take 60 mg daily by mouth.  . furosemide (  LASIX) 40 MG tablet Take 1 tablet (40 mg total) daily by mouth.  Marland Kitchen glimepiride (AMARYL) 2 MG tablet Take 2 mg by mouth daily.  . methimazole (TAPAZOLE) 5 MG tablet Take 2.5 mg daily by mouth.  . metoprolol succinate (TOPROL-XL) 25 MG 24 hr tablet Take 1 tablet (25 mg total) daily by mouth. Take with or immediately following a meal.  . pravastatin (PRAVACHOL) 20 MG tablet Take 1 tablet (20 mg total) every evening by mouth.  . [DISCONTINUED] amiodarone (PACERONE) 200 MG tablet Take 1 tablet (200 mg total) daily by mouth. (Patient taking differently: Take 200 mg by mouth 2 (two) times daily. )  . [DISCONTINUED]  amiodarone (PACERONE) 200 MG tablet Take 200 mg by mouth 2 (two) times daily.     Allergies:   Sulfa antibiotics   Social History   Socioeconomic History  . Marital status: Widowed    Spouse name: None  . Number of children: None  . Years of education: None  . Highest education level: None  Social Needs  . Financial resource strain: None  . Food insecurity - worry: None  . Food insecurity - inability: None  . Transportation needs - medical: None  . Transportation needs - non-medical: None  Occupational History  . None  Tobacco Use  . Smoking status: Never Smoker  . Smokeless tobacco: Never Used  Substance and Sexual Activity  . Alcohol use: No    Frequency: Never  . Drug use: No  . Sexual activity: None  Other Topics Concern  . None  Social History Narrative  . None     Family History: The patient's family history includes Hypertension in her mother; Stroke in her mother. ROS:   Please see the history of present illness.    All other systems reviewed and are negative.  EKGs/Labs/Other Studies Reviewed:    The following studies were reviewed today:  EKG:  EKG ordered today.  The ekg ordered today demonstrates [place] LAHB U waves  Recent Labs: No results found for requested labs within last 8760 hours.  Recent Lipid Panel No results found for: CHOL, TRIG, HDL, CHOLHDL, VLDL, LDLCALC, LDLDIRECT  Physical Exam:    VS:  BP 100/66 (BP Location: Right Arm, Patient Position: Sitting, Cuff Size: Normal)   Pulse 82   Ht 5\' 3"  (1.6 m)   Wt 118 lb (53.5 kg)   SpO2 97%   BMI 20.90 kg/m     Wt Readings from Last 3 Encounters:  12/27/16 118 lb (53.5 kg)  12/19/16 116 lb (52.6 kg)     GEN:  Well nourished, well developed in no acute distress HEENT: Normal NECK: No JVD; No carotid bruits LYMPHATICS: No lymphadenopathy CARDIAC: RRR, no murmurs, rubs, gallops RESPIRATORY:  Clear to auscultation without rales, wheezing or rhonchi  ABDOMEN: Soft, non-tender,  non-distended MUSCULOSKELETAL:  No edema; No deformity  SKIN: Warm and dry NEUROLOGIC:  Alert and oriented x 3 PSYCHIATRIC:  Normal affect    Signed, Shirlee More, MD  12/27/2016 10:38 AM    [place]

## 2016-12-27 ENCOUNTER — Encounter: Payer: Self-pay | Admitting: Cardiology

## 2016-12-27 ENCOUNTER — Ambulatory Visit (INDEPENDENT_AMBULATORY_CARE_PROVIDER_SITE_OTHER): Payer: Medicare Other | Admitting: Cardiology

## 2016-12-27 ENCOUNTER — Other Ambulatory Visit: Payer: Self-pay | Admitting: *Deleted

## 2016-12-27 VITALS — BP 100/66 | HR 82 | Ht 63.0 in | Wt 118.0 lb

## 2016-12-27 DIAGNOSIS — I5022 Chronic systolic (congestive) heart failure: Secondary | ICD-10-CM

## 2016-12-27 DIAGNOSIS — Z79899 Other long term (current) drug therapy: Secondary | ICD-10-CM | POA: Diagnosis not present

## 2016-12-27 DIAGNOSIS — S81802D Unspecified open wound, left lower leg, subsequent encounter: Secondary | ICD-10-CM | POA: Diagnosis not present

## 2016-12-27 DIAGNOSIS — E1151 Type 2 diabetes mellitus with diabetic peripheral angiopathy without gangrene: Secondary | ICD-10-CM | POA: Diagnosis not present

## 2016-12-27 DIAGNOSIS — I4891 Unspecified atrial fibrillation: Secondary | ICD-10-CM | POA: Diagnosis not present

## 2016-12-27 DIAGNOSIS — I214 Non-ST elevation (NSTEMI) myocardial infarction: Secondary | ICD-10-CM | POA: Diagnosis not present

## 2016-12-27 DIAGNOSIS — I480 Paroxysmal atrial fibrillation: Secondary | ICD-10-CM | POA: Diagnosis not present

## 2016-12-27 DIAGNOSIS — I110 Hypertensive heart disease with heart failure: Secondary | ICD-10-CM | POA: Diagnosis not present

## 2016-12-27 DIAGNOSIS — Z7901 Long term (current) use of anticoagulants: Secondary | ICD-10-CM

## 2016-12-27 MED ORDER — AMIODARONE HCL 200 MG PO TABS: 200.0000 mg | ORAL_TABLET | Freq: Every day | ORAL | 3 refills | Status: AC

## 2016-12-27 NOTE — Patient Instructions (Signed)
Medication Instructions:  Your physician has recommended you make the following change in your medication:  DECREASE amiodarone to once daily  Labwork: Your physician recommends that you return for lab work in: today. BMP, BNP  Testing/Procedures: You had an EKG today.  Follow-Up: Your physician wants you to follow-up in: 3 months. You will receive a reminder letter in the mail two months in advance. If you don't receive a letter, please call our office to schedule the follow-up appointment.  Any Other Special Instructions Will Be Listed Below (If Applicable).     If you need a refill on your cardiac medications before your next appointment, please call your pharmacy.

## 2016-12-28 ENCOUNTER — Telehealth: Payer: Self-pay

## 2016-12-28 ENCOUNTER — Telehealth: Payer: Self-pay | Admitting: Cardiology

## 2016-12-28 DIAGNOSIS — Z85828 Personal history of other malignant neoplasm of skin: Secondary | ICD-10-CM | POA: Diagnosis not present

## 2016-12-28 DIAGNOSIS — C946 Myelodysplastic disease, not classified: Secondary | ICD-10-CM | POA: Diagnosis not present

## 2016-12-28 DIAGNOSIS — E119 Type 2 diabetes mellitus without complications: Secondary | ICD-10-CM | POA: Diagnosis not present

## 2016-12-28 DIAGNOSIS — Z48817 Encounter for surgical aftercare following surgery on the skin and subcutaneous tissue: Secondary | ICD-10-CM | POA: Diagnosis not present

## 2016-12-28 DIAGNOSIS — N179 Acute kidney failure, unspecified: Secondary | ICD-10-CM | POA: Diagnosis not present

## 2016-12-28 DIAGNOSIS — I509 Heart failure, unspecified: Secondary | ICD-10-CM | POA: Diagnosis not present

## 2016-12-28 DIAGNOSIS — I10 Essential (primary) hypertension: Secondary | ICD-10-CM | POA: Diagnosis not present

## 2016-12-28 DIAGNOSIS — Z7901 Long term (current) use of anticoagulants: Secondary | ICD-10-CM | POA: Diagnosis not present

## 2016-12-28 DIAGNOSIS — I4891 Unspecified atrial fibrillation: Secondary | ICD-10-CM | POA: Diagnosis not present

## 2016-12-28 DIAGNOSIS — E876 Hypokalemia: Secondary | ICD-10-CM | POA: Diagnosis not present

## 2016-12-28 DIAGNOSIS — S81802A Unspecified open wound, left lower leg, initial encounter: Secondary | ICD-10-CM | POA: Diagnosis not present

## 2016-12-28 DIAGNOSIS — D473 Essential (hemorrhagic) thrombocythemia: Secondary | ICD-10-CM | POA: Diagnosis not present

## 2016-12-28 DIAGNOSIS — E7800 Pure hypercholesterolemia, unspecified: Secondary | ICD-10-CM | POA: Diagnosis not present

## 2016-12-28 DIAGNOSIS — D72829 Elevated white blood cell count, unspecified: Secondary | ICD-10-CM | POA: Diagnosis not present

## 2016-12-28 DIAGNOSIS — Z7984 Long term (current) use of oral hypoglycemic drugs: Secondary | ICD-10-CM | POA: Diagnosis not present

## 2016-12-28 DIAGNOSIS — E0590 Thyrotoxicosis, unspecified without thyrotoxic crisis or storm: Secondary | ICD-10-CM | POA: Diagnosis not present

## 2016-12-28 NOTE — Telephone Encounter (Signed)
Called by the lab with K+ 2.4 from lab draw yesterday morning. Unable to reach patient this am. Will attempt to call again. Message sent to primary cardiologist as well.   Reino Bellis NP

## 2016-12-28 NOTE — Telephone Encounter (Signed)
Contacted patient's daughter, Fraser Din, per DPR, regarding critical potassium of 2.4. Beecher Mcardle, per Dr. Bettina Gavia, to take patient to the emergency department for IV potassium. Advised that potassium is critically low and oral potassium will not help. Advised critical result could cause arrhythmia's. Pat asked if this could be done at the patient's PCP. Beecher Mcardle that patient should be taken to the emergency department due to PCP's not being equipped to give IV potassium. Pat verbalized understanding and taking patient to the nearest emergency department, [facility]. No further questions.

## 2016-12-29 DIAGNOSIS — E1151 Type 2 diabetes mellitus with diabetic peripheral angiopathy without gangrene: Secondary | ICD-10-CM | POA: Diagnosis not present

## 2016-12-29 DIAGNOSIS — E876 Hypokalemia: Secondary | ICD-10-CM | POA: Diagnosis not present

## 2016-12-29 DIAGNOSIS — N179 Acute kidney failure, unspecified: Secondary | ICD-10-CM | POA: Diagnosis not present

## 2016-12-29 DIAGNOSIS — S81802D Unspecified open wound, left lower leg, subsequent encounter: Secondary | ICD-10-CM | POA: Diagnosis not present

## 2016-12-29 DIAGNOSIS — T8189XA Other complications of procedures, not elsewhere classified, initial encounter: Secondary | ICD-10-CM | POA: Diagnosis not present

## 2016-12-29 DIAGNOSIS — I214 Non-ST elevation (NSTEMI) myocardial infarction: Secondary | ICD-10-CM | POA: Diagnosis not present

## 2016-12-29 DIAGNOSIS — I110 Hypertensive heart disease with heart failure: Secondary | ICD-10-CM | POA: Diagnosis not present

## 2016-12-29 DIAGNOSIS — I4891 Unspecified atrial fibrillation: Secondary | ICD-10-CM | POA: Diagnosis not present

## 2016-12-29 DIAGNOSIS — I2510 Atherosclerotic heart disease of native coronary artery without angina pectoris: Secondary | ICD-10-CM | POA: Diagnosis not present

## 2016-12-29 DIAGNOSIS — I5022 Chronic systolic (congestive) heart failure: Secondary | ICD-10-CM | POA: Diagnosis not present

## 2016-12-29 LAB — BASIC METABOLIC PANEL
BUN / CREAT RATIO: 7 — AB (ref 12–28)
BUN: 14 mg/dL (ref 8–27)
CHLORIDE: 90 mmol/L — AB (ref 96–106)
CO2: 32 mmol/L — ABNORMAL HIGH (ref 20–29)
Calcium: 9 mg/dL (ref 8.7–10.3)
Creatinine, Ser: 2.11 mg/dL — ABNORMAL HIGH (ref 0.57–1.00)
GFR calc Af Amer: 23 mL/min/{1.73_m2} — ABNORMAL LOW (ref >59)
GFR calc non Af Amer: 20 mL/min/{1.73_m2} — ABNORMAL LOW (ref >59)
GLUCOSE: 257 mg/dL — AB (ref 65–99)
Potassium: 2.4 mmol/L — CL (ref 3.5–5.2)
Sodium: 141 mmol/L (ref 134–144)

## 2016-12-29 LAB — BRAIN NATRIURETIC PEPTIDE: BNP: 107.6 pg/mL — AB (ref 0.0–100.0)

## 2016-12-30 DIAGNOSIS — I110 Hypertensive heart disease with heart failure: Secondary | ICD-10-CM | POA: Diagnosis not present

## 2016-12-30 DIAGNOSIS — S81802D Unspecified open wound, left lower leg, subsequent encounter: Secondary | ICD-10-CM | POA: Diagnosis not present

## 2016-12-30 DIAGNOSIS — I214 Non-ST elevation (NSTEMI) myocardial infarction: Secondary | ICD-10-CM | POA: Diagnosis not present

## 2016-12-30 DIAGNOSIS — I4891 Unspecified atrial fibrillation: Secondary | ICD-10-CM | POA: Diagnosis not present

## 2016-12-30 DIAGNOSIS — E1151 Type 2 diabetes mellitus with diabetic peripheral angiopathy without gangrene: Secondary | ICD-10-CM | POA: Diagnosis not present

## 2016-12-30 DIAGNOSIS — I5022 Chronic systolic (congestive) heart failure: Secondary | ICD-10-CM | POA: Diagnosis not present

## 2017-01-03 DIAGNOSIS — E1165 Type 2 diabetes mellitus with hyperglycemia: Secondary | ICD-10-CM | POA: Diagnosis not present

## 2017-01-03 DIAGNOSIS — Z6820 Body mass index (BMI) 20.0-20.9, adult: Secondary | ICD-10-CM | POA: Diagnosis not present

## 2017-01-03 DIAGNOSIS — E1143 Type 2 diabetes mellitus with diabetic autonomic (poly)neuropathy: Secondary | ICD-10-CM | POA: Diagnosis not present

## 2017-01-03 DIAGNOSIS — Z9181 History of falling: Secondary | ICD-10-CM | POA: Diagnosis not present

## 2017-01-03 DIAGNOSIS — I10 Essential (primary) hypertension: Secondary | ICD-10-CM | POA: Diagnosis not present

## 2017-01-03 DIAGNOSIS — N179 Acute kidney failure, unspecified: Secondary | ICD-10-CM | POA: Diagnosis not present

## 2017-01-03 DIAGNOSIS — Z1331 Encounter for screening for depression: Secondary | ICD-10-CM | POA: Diagnosis not present

## 2017-01-04 DIAGNOSIS — I110 Hypertensive heart disease with heart failure: Secondary | ICD-10-CM | POA: Diagnosis not present

## 2017-01-04 DIAGNOSIS — I214 Non-ST elevation (NSTEMI) myocardial infarction: Secondary | ICD-10-CM | POA: Diagnosis not present

## 2017-01-04 DIAGNOSIS — I4891 Unspecified atrial fibrillation: Secondary | ICD-10-CM | POA: Diagnosis not present

## 2017-01-04 DIAGNOSIS — S81802D Unspecified open wound, left lower leg, subsequent encounter: Secondary | ICD-10-CM | POA: Diagnosis not present

## 2017-01-04 DIAGNOSIS — I5022 Chronic systolic (congestive) heart failure: Secondary | ICD-10-CM | POA: Diagnosis not present

## 2017-01-04 DIAGNOSIS — E1151 Type 2 diabetes mellitus with diabetic peripheral angiopathy without gangrene: Secondary | ICD-10-CM | POA: Diagnosis not present

## 2017-01-05 DIAGNOSIS — I110 Hypertensive heart disease with heart failure: Secondary | ICD-10-CM | POA: Diagnosis not present

## 2017-01-05 DIAGNOSIS — E1151 Type 2 diabetes mellitus with diabetic peripheral angiopathy without gangrene: Secondary | ICD-10-CM | POA: Diagnosis not present

## 2017-01-05 DIAGNOSIS — Z09 Encounter for follow-up examination after completed treatment for conditions other than malignant neoplasm: Secondary | ICD-10-CM | POA: Diagnosis not present

## 2017-01-05 DIAGNOSIS — S81802D Unspecified open wound, left lower leg, subsequent encounter: Secondary | ICD-10-CM | POA: Diagnosis not present

## 2017-01-05 DIAGNOSIS — L97829 Non-pressure chronic ulcer of other part of left lower leg with unspecified severity: Secondary | ICD-10-CM | POA: Diagnosis not present

## 2017-01-05 DIAGNOSIS — I4891 Unspecified atrial fibrillation: Secondary | ICD-10-CM | POA: Diagnosis not present

## 2017-01-05 DIAGNOSIS — I214 Non-ST elevation (NSTEMI) myocardial infarction: Secondary | ICD-10-CM | POA: Diagnosis not present

## 2017-01-05 DIAGNOSIS — I5022 Chronic systolic (congestive) heart failure: Secondary | ICD-10-CM | POA: Diagnosis not present

## 2017-01-06 DIAGNOSIS — I5022 Chronic systolic (congestive) heart failure: Secondary | ICD-10-CM | POA: Diagnosis not present

## 2017-01-06 DIAGNOSIS — S81802D Unspecified open wound, left lower leg, subsequent encounter: Secondary | ICD-10-CM | POA: Diagnosis not present

## 2017-01-06 DIAGNOSIS — I214 Non-ST elevation (NSTEMI) myocardial infarction: Secondary | ICD-10-CM | POA: Diagnosis not present

## 2017-01-06 DIAGNOSIS — E1151 Type 2 diabetes mellitus with diabetic peripheral angiopathy without gangrene: Secondary | ICD-10-CM | POA: Diagnosis not present

## 2017-01-06 DIAGNOSIS — I110 Hypertensive heart disease with heart failure: Secondary | ICD-10-CM | POA: Diagnosis not present

## 2017-01-06 DIAGNOSIS — I4891 Unspecified atrial fibrillation: Secondary | ICD-10-CM | POA: Diagnosis not present

## 2017-01-08 DIAGNOSIS — I4891 Unspecified atrial fibrillation: Secondary | ICD-10-CM | POA: Diagnosis not present

## 2017-01-08 DIAGNOSIS — I214 Non-ST elevation (NSTEMI) myocardial infarction: Secondary | ICD-10-CM | POA: Diagnosis not present

## 2017-01-10 DIAGNOSIS — I5022 Chronic systolic (congestive) heart failure: Secondary | ICD-10-CM | POA: Diagnosis not present

## 2017-01-10 DIAGNOSIS — E1151 Type 2 diabetes mellitus with diabetic peripheral angiopathy without gangrene: Secondary | ICD-10-CM | POA: Diagnosis not present

## 2017-01-10 DIAGNOSIS — S81802D Unspecified open wound, left lower leg, subsequent encounter: Secondary | ICD-10-CM | POA: Diagnosis not present

## 2017-01-10 DIAGNOSIS — I4891 Unspecified atrial fibrillation: Secondary | ICD-10-CM | POA: Diagnosis not present

## 2017-01-10 DIAGNOSIS — I110 Hypertensive heart disease with heart failure: Secondary | ICD-10-CM | POA: Diagnosis not present

## 2017-01-10 DIAGNOSIS — I214 Non-ST elevation (NSTEMI) myocardial infarction: Secondary | ICD-10-CM | POA: Diagnosis not present

## 2017-01-18 DIAGNOSIS — I214 Non-ST elevation (NSTEMI) myocardial infarction: Secondary | ICD-10-CM | POA: Diagnosis not present

## 2017-01-18 DIAGNOSIS — I110 Hypertensive heart disease with heart failure: Secondary | ICD-10-CM | POA: Diagnosis not present

## 2017-01-18 DIAGNOSIS — I4891 Unspecified atrial fibrillation: Secondary | ICD-10-CM | POA: Diagnosis not present

## 2017-01-18 DIAGNOSIS — E1151 Type 2 diabetes mellitus with diabetic peripheral angiopathy without gangrene: Secondary | ICD-10-CM | POA: Diagnosis not present

## 2017-01-18 DIAGNOSIS — S81802D Unspecified open wound, left lower leg, subsequent encounter: Secondary | ICD-10-CM | POA: Diagnosis not present

## 2017-01-18 DIAGNOSIS — I5022 Chronic systolic (congestive) heart failure: Secondary | ICD-10-CM | POA: Diagnosis not present

## 2017-02-16 DIAGNOSIS — H2513 Age-related nuclear cataract, bilateral: Secondary | ICD-10-CM | POA: Diagnosis not present

## 2017-02-16 DIAGNOSIS — E119 Type 2 diabetes mellitus without complications: Secondary | ICD-10-CM | POA: Diagnosis not present

## 2017-03-08 DIAGNOSIS — E0590 Thyrotoxicosis, unspecified without thyrotoxic crisis or storm: Secondary | ICD-10-CM | POA: Diagnosis not present

## 2017-03-08 DIAGNOSIS — I10 Essential (primary) hypertension: Secondary | ICD-10-CM | POA: Diagnosis not present

## 2017-03-08 DIAGNOSIS — D471 Chronic myeloproliferative disease: Secondary | ICD-10-CM | POA: Diagnosis not present

## 2017-03-08 DIAGNOSIS — E119 Type 2 diabetes mellitus without complications: Secondary | ICD-10-CM | POA: Diagnosis not present

## 2017-03-09 DIAGNOSIS — E1143 Type 2 diabetes mellitus with diabetic autonomic (poly)neuropathy: Secondary | ICD-10-CM | POA: Diagnosis not present

## 2017-03-09 DIAGNOSIS — E039 Hypothyroidism, unspecified: Secondary | ICD-10-CM | POA: Diagnosis not present

## 2017-03-09 DIAGNOSIS — I10 Essential (primary) hypertension: Secondary | ICD-10-CM | POA: Diagnosis not present

## 2017-04-11 DIAGNOSIS — S199XXA Unspecified injury of neck, initial encounter: Secondary | ICD-10-CM | POA: Diagnosis not present

## 2017-04-11 DIAGNOSIS — S0091XA Abrasion of unspecified part of head, initial encounter: Secondary | ICD-10-CM | POA: Diagnosis not present

## 2017-04-11 DIAGNOSIS — S0990XA Unspecified injury of head, initial encounter: Secondary | ICD-10-CM | POA: Diagnosis not present

## 2017-04-13 DIAGNOSIS — D649 Anemia, unspecified: Secondary | ICD-10-CM | POA: Diagnosis not present

## 2017-04-13 DIAGNOSIS — R41841 Cognitive communication deficit: Secondary | ICD-10-CM | POA: Diagnosis not present

## 2017-04-13 DIAGNOSIS — E785 Hyperlipidemia, unspecified: Secondary | ICD-10-CM | POA: Diagnosis not present

## 2017-04-13 DIAGNOSIS — I160 Hypertensive urgency: Secondary | ICD-10-CM | POA: Diagnosis present

## 2017-04-13 DIAGNOSIS — I130 Hypertensive heart and chronic kidney disease with heart failure and stage 1 through stage 4 chronic kidney disease, or unspecified chronic kidney disease: Secondary | ICD-10-CM | POA: Diagnosis not present

## 2017-04-13 DIAGNOSIS — F0390 Unspecified dementia without behavioral disturbance: Secondary | ICD-10-CM | POA: Diagnosis present

## 2017-04-13 DIAGNOSIS — I4891 Unspecified atrial fibrillation: Secondary | ICD-10-CM | POA: Diagnosis not present

## 2017-04-13 DIAGNOSIS — S0003XD Contusion of scalp, subsequent encounter: Secondary | ICD-10-CM | POA: Diagnosis not present

## 2017-04-13 DIAGNOSIS — S0093XA Contusion of unspecified part of head, initial encounter: Secondary | ICD-10-CM | POA: Diagnosis present

## 2017-04-13 DIAGNOSIS — J42 Unspecified chronic bronchitis: Secondary | ICD-10-CM | POA: Diagnosis not present

## 2017-04-13 DIAGNOSIS — N179 Acute kidney failure, unspecified: Secondary | ICD-10-CM | POA: Diagnosis not present

## 2017-04-13 DIAGNOSIS — D638 Anemia in other chronic diseases classified elsewhere: Secondary | ICD-10-CM | POA: Diagnosis present

## 2017-04-13 DIAGNOSIS — D473 Essential (hemorrhagic) thrombocythemia: Secondary | ICD-10-CM | POA: Diagnosis present

## 2017-04-13 DIAGNOSIS — M6281 Muscle weakness (generalized): Secondary | ICD-10-CM | POA: Diagnosis not present

## 2017-04-13 DIAGNOSIS — D471 Chronic myeloproliferative disease: Secondary | ICD-10-CM | POA: Diagnosis not present

## 2017-04-13 DIAGNOSIS — E0590 Thyrotoxicosis, unspecified without thyrotoxic crisis or storm: Secondary | ICD-10-CM | POA: Diagnosis present

## 2017-04-13 DIAGNOSIS — Z7984 Long term (current) use of oral hypoglycemic drugs: Secondary | ICD-10-CM | POA: Diagnosis not present

## 2017-04-13 DIAGNOSIS — Z7902 Long term (current) use of antithrombotics/antiplatelets: Secondary | ICD-10-CM | POA: Diagnosis not present

## 2017-04-13 DIAGNOSIS — J449 Chronic obstructive pulmonary disease, unspecified: Secondary | ICD-10-CM | POA: Diagnosis not present

## 2017-04-13 DIAGNOSIS — R509 Fever, unspecified: Secondary | ICD-10-CM | POA: Diagnosis not present

## 2017-04-13 DIAGNOSIS — I10 Essential (primary) hypertension: Secondary | ICD-10-CM | POA: Diagnosis not present

## 2017-04-13 DIAGNOSIS — I214 Non-ST elevation (NSTEMI) myocardial infarction: Secondary | ICD-10-CM | POA: Diagnosis not present

## 2017-04-13 DIAGNOSIS — N183 Chronic kidney disease, stage 3 (moderate): Secondary | ICD-10-CM | POA: Diagnosis present

## 2017-04-13 DIAGNOSIS — E1122 Type 2 diabetes mellitus with diabetic chronic kidney disease: Secondary | ICD-10-CM | POA: Diagnosis present

## 2017-04-13 DIAGNOSIS — S79911A Unspecified injury of right hip, initial encounter: Secondary | ICD-10-CM | POA: Diagnosis not present

## 2017-04-13 DIAGNOSIS — I252 Old myocardial infarction: Secondary | ICD-10-CM | POA: Diagnosis not present

## 2017-04-13 DIAGNOSIS — J181 Lobar pneumonia, unspecified organism: Secondary | ICD-10-CM | POA: Diagnosis not present

## 2017-04-13 DIAGNOSIS — R2681 Unsteadiness on feet: Secondary | ICD-10-CM | POA: Diagnosis not present

## 2017-04-13 DIAGNOSIS — M25551 Pain in right hip: Secondary | ICD-10-CM | POA: Diagnosis not present

## 2017-04-13 DIAGNOSIS — Z7901 Long term (current) use of anticoagulants: Secondary | ICD-10-CM | POA: Diagnosis not present

## 2017-04-13 DIAGNOSIS — R05 Cough: Secondary | ICD-10-CM | POA: Diagnosis not present

## 2017-04-13 DIAGNOSIS — R488 Other symbolic dysfunctions: Secondary | ICD-10-CM | POA: Diagnosis not present

## 2017-04-13 DIAGNOSIS — I2510 Atherosclerotic heart disease of native coronary artery without angina pectoris: Secondary | ICD-10-CM | POA: Diagnosis present

## 2017-04-13 DIAGNOSIS — E119 Type 2 diabetes mellitus without complications: Secondary | ICD-10-CM | POA: Diagnosis not present

## 2017-04-13 DIAGNOSIS — Z9181 History of falling: Secondary | ICD-10-CM | POA: Diagnosis not present

## 2017-04-13 DIAGNOSIS — W19XXXA Unspecified fall, initial encounter: Secondary | ICD-10-CM | POA: Diagnosis not present

## 2017-04-13 DIAGNOSIS — Z79899 Other long term (current) drug therapy: Secondary | ICD-10-CM | POA: Diagnosis not present

## 2017-04-13 DIAGNOSIS — A419 Sepsis, unspecified organism: Secondary | ICD-10-CM | POA: Diagnosis not present

## 2017-04-13 DIAGNOSIS — I4581 Long QT syndrome: Secondary | ICD-10-CM | POA: Diagnosis present

## 2017-04-13 DIAGNOSIS — R0902 Hypoxemia: Secondary | ICD-10-CM | POA: Diagnosis not present

## 2017-04-13 DIAGNOSIS — J9601 Acute respiratory failure with hypoxia: Secondary | ICD-10-CM | POA: Diagnosis not present

## 2017-04-13 DIAGNOSIS — I5022 Chronic systolic (congestive) heart failure: Secondary | ICD-10-CM | POA: Diagnosis present

## 2017-04-13 DIAGNOSIS — J189 Pneumonia, unspecified organism: Secondary | ICD-10-CM | POA: Diagnosis not present

## 2017-04-13 DIAGNOSIS — S0990XA Unspecified injury of head, initial encounter: Secondary | ICD-10-CM | POA: Diagnosis not present

## 2017-04-13 DIAGNOSIS — R0602 Shortness of breath: Secondary | ICD-10-CM | POA: Diagnosis not present

## 2017-04-18 DIAGNOSIS — Z7901 Long term (current) use of anticoagulants: Secondary | ICD-10-CM | POA: Diagnosis not present

## 2017-04-18 DIAGNOSIS — I214 Non-ST elevation (NSTEMI) myocardial infarction: Secondary | ICD-10-CM | POA: Diagnosis not present

## 2017-04-18 DIAGNOSIS — D649 Anemia, unspecified: Secondary | ICD-10-CM | POA: Diagnosis not present

## 2017-04-18 DIAGNOSIS — I5022 Chronic systolic (congestive) heart failure: Secondary | ICD-10-CM | POA: Diagnosis not present

## 2017-04-18 DIAGNOSIS — J449 Chronic obstructive pulmonary disease, unspecified: Secondary | ICD-10-CM | POA: Diagnosis not present

## 2017-04-18 DIAGNOSIS — I480 Paroxysmal atrial fibrillation: Secondary | ICD-10-CM | POA: Diagnosis not present

## 2017-04-18 DIAGNOSIS — Z9181 History of falling: Secondary | ICD-10-CM | POA: Diagnosis not present

## 2017-04-18 DIAGNOSIS — J189 Pneumonia, unspecified organism: Secondary | ICD-10-CM | POA: Diagnosis not present

## 2017-04-18 DIAGNOSIS — J9601 Acute respiratory failure with hypoxia: Secondary | ICD-10-CM | POA: Diagnosis not present

## 2017-04-18 DIAGNOSIS — J42 Unspecified chronic bronchitis: Secondary | ICD-10-CM | POA: Diagnosis not present

## 2017-04-18 DIAGNOSIS — N179 Acute kidney failure, unspecified: Secondary | ICD-10-CM | POA: Diagnosis not present

## 2017-04-18 DIAGNOSIS — I2510 Atherosclerotic heart disease of native coronary artery without angina pectoris: Secondary | ICD-10-CM | POA: Diagnosis not present

## 2017-04-18 DIAGNOSIS — S0003XD Contusion of scalp, subsequent encounter: Secondary | ICD-10-CM | POA: Diagnosis not present

## 2017-04-18 DIAGNOSIS — W19XXXA Unspecified fall, initial encounter: Secondary | ICD-10-CM | POA: Diagnosis not present

## 2017-04-18 DIAGNOSIS — E785 Hyperlipidemia, unspecified: Secondary | ICD-10-CM | POA: Diagnosis not present

## 2017-04-18 DIAGNOSIS — E119 Type 2 diabetes mellitus without complications: Secondary | ICD-10-CM | POA: Diagnosis not present

## 2017-04-18 DIAGNOSIS — R2681 Unsteadiness on feet: Secondary | ICD-10-CM | POA: Diagnosis not present

## 2017-04-18 DIAGNOSIS — A419 Sepsis, unspecified organism: Secondary | ICD-10-CM | POA: Diagnosis not present

## 2017-04-18 DIAGNOSIS — J181 Lobar pneumonia, unspecified organism: Secondary | ICD-10-CM | POA: Diagnosis not present

## 2017-04-18 DIAGNOSIS — R41841 Cognitive communication deficit: Secondary | ICD-10-CM | POA: Diagnosis not present

## 2017-04-18 DIAGNOSIS — R05 Cough: Secondary | ICD-10-CM | POA: Diagnosis not present

## 2017-04-18 DIAGNOSIS — S0990XA Unspecified injury of head, initial encounter: Secondary | ICD-10-CM | POA: Diagnosis not present

## 2017-04-18 DIAGNOSIS — E0590 Thyrotoxicosis, unspecified without thyrotoxic crisis or storm: Secondary | ICD-10-CM | POA: Diagnosis not present

## 2017-04-18 DIAGNOSIS — I4891 Unspecified atrial fibrillation: Secondary | ICD-10-CM | POA: Diagnosis not present

## 2017-04-18 DIAGNOSIS — M6281 Muscle weakness (generalized): Secondary | ICD-10-CM | POA: Diagnosis not present

## 2017-04-18 DIAGNOSIS — I10 Essential (primary) hypertension: Secondary | ICD-10-CM | POA: Diagnosis not present

## 2017-04-18 DIAGNOSIS — R488 Other symbolic dysfunctions: Secondary | ICD-10-CM | POA: Diagnosis not present

## 2017-04-18 DIAGNOSIS — D471 Chronic myeloproliferative disease: Secondary | ICD-10-CM | POA: Diagnosis not present

## 2017-04-18 DIAGNOSIS — R0902 Hypoxemia: Secondary | ICD-10-CM | POA: Diagnosis not present

## 2017-04-20 DIAGNOSIS — I10 Essential (primary) hypertension: Secondary | ICD-10-CM | POA: Diagnosis not present

## 2017-04-20 DIAGNOSIS — D471 Chronic myeloproliferative disease: Secondary | ICD-10-CM | POA: Diagnosis not present

## 2017-04-20 DIAGNOSIS — J189 Pneumonia, unspecified organism: Secondary | ICD-10-CM | POA: Diagnosis not present

## 2017-04-20 DIAGNOSIS — I480 Paroxysmal atrial fibrillation: Secondary | ICD-10-CM | POA: Diagnosis not present

## 2017-04-20 DIAGNOSIS — I4891 Unspecified atrial fibrillation: Secondary | ICD-10-CM | POA: Diagnosis not present

## 2017-04-20 DIAGNOSIS — E119 Type 2 diabetes mellitus without complications: Secondary | ICD-10-CM | POA: Diagnosis not present

## 2017-04-20 DIAGNOSIS — E0590 Thyrotoxicosis, unspecified without thyrotoxic crisis or storm: Secondary | ICD-10-CM | POA: Diagnosis not present

## 2017-04-30 DIAGNOSIS — E119 Type 2 diabetes mellitus without complications: Secondary | ICD-10-CM | POA: Diagnosis not present

## 2017-04-30 DIAGNOSIS — J189 Pneumonia, unspecified organism: Secondary | ICD-10-CM | POA: Diagnosis not present

## 2017-04-30 DIAGNOSIS — D471 Chronic myeloproliferative disease: Secondary | ICD-10-CM | POA: Diagnosis not present

## 2017-04-30 DIAGNOSIS — E0590 Thyrotoxicosis, unspecified without thyrotoxic crisis or storm: Secondary | ICD-10-CM | POA: Diagnosis not present

## 2017-04-30 DIAGNOSIS — I480 Paroxysmal atrial fibrillation: Secondary | ICD-10-CM | POA: Diagnosis not present

## 2017-04-30 DIAGNOSIS — I4891 Unspecified atrial fibrillation: Secondary | ICD-10-CM | POA: Diagnosis not present

## 2017-04-30 DIAGNOSIS — I10 Essential (primary) hypertension: Secondary | ICD-10-CM | POA: Diagnosis not present

## 2017-05-03 DIAGNOSIS — I4891 Unspecified atrial fibrillation: Secondary | ICD-10-CM | POA: Diagnosis not present

## 2017-05-03 DIAGNOSIS — J189 Pneumonia, unspecified organism: Secondary | ICD-10-CM | POA: Diagnosis not present

## 2017-05-03 DIAGNOSIS — E0590 Thyrotoxicosis, unspecified without thyrotoxic crisis or storm: Secondary | ICD-10-CM | POA: Diagnosis not present

## 2017-05-03 DIAGNOSIS — I10 Essential (primary) hypertension: Secondary | ICD-10-CM | POA: Diagnosis not present

## 2017-05-03 DIAGNOSIS — D471 Chronic myeloproliferative disease: Secondary | ICD-10-CM | POA: Diagnosis not present

## 2017-05-03 DIAGNOSIS — I480 Paroxysmal atrial fibrillation: Secondary | ICD-10-CM | POA: Diagnosis not present

## 2017-05-03 DIAGNOSIS — E119 Type 2 diabetes mellitus without complications: Secondary | ICD-10-CM | POA: Diagnosis not present

## 2017-05-13 DIAGNOSIS — I4891 Unspecified atrial fibrillation: Secondary | ICD-10-CM | POA: Diagnosis not present

## 2017-05-13 DIAGNOSIS — E119 Type 2 diabetes mellitus without complications: Secondary | ICD-10-CM | POA: Diagnosis not present

## 2017-05-13 DIAGNOSIS — D471 Chronic myeloproliferative disease: Secondary | ICD-10-CM | POA: Diagnosis not present

## 2017-05-13 DIAGNOSIS — E0590 Thyrotoxicosis, unspecified without thyrotoxic crisis or storm: Secondary | ICD-10-CM | POA: Diagnosis not present

## 2017-05-13 DIAGNOSIS — I480 Paroxysmal atrial fibrillation: Secondary | ICD-10-CM | POA: Diagnosis not present

## 2017-05-13 DIAGNOSIS — I10 Essential (primary) hypertension: Secondary | ICD-10-CM | POA: Diagnosis not present

## 2017-05-13 DIAGNOSIS — J189 Pneumonia, unspecified organism: Secondary | ICD-10-CM | POA: Diagnosis not present

## 2017-05-15 DIAGNOSIS — E1140 Type 2 diabetes mellitus with diabetic neuropathy, unspecified: Secondary | ICD-10-CM | POA: Diagnosis not present

## 2017-05-15 DIAGNOSIS — M1990 Unspecified osteoarthritis, unspecified site: Secondary | ICD-10-CM | POA: Diagnosis not present

## 2017-05-15 DIAGNOSIS — I4891 Unspecified atrial fibrillation: Secondary | ICD-10-CM | POA: Diagnosis not present

## 2017-05-15 DIAGNOSIS — Z7984 Long term (current) use of oral hypoglycemic drugs: Secondary | ICD-10-CM | POA: Diagnosis not present

## 2017-05-15 DIAGNOSIS — S0100XA Unspecified open wound of scalp, initial encounter: Secondary | ICD-10-CM | POA: Diagnosis not present

## 2017-05-15 DIAGNOSIS — F0391 Unspecified dementia with behavioral disturbance: Secondary | ICD-10-CM | POA: Diagnosis not present

## 2017-05-15 DIAGNOSIS — I252 Old myocardial infarction: Secondary | ICD-10-CM | POA: Diagnosis not present

## 2017-05-15 DIAGNOSIS — I10 Essential (primary) hypertension: Secondary | ICD-10-CM | POA: Diagnosis not present

## 2017-06-06 DIAGNOSIS — Z09 Encounter for follow-up examination after completed treatment for conditions other than malignant neoplasm: Secondary | ICD-10-CM | POA: Diagnosis not present

## 2017-06-06 DIAGNOSIS — F0391 Unspecified dementia with behavioral disturbance: Secondary | ICD-10-CM | POA: Diagnosis not present

## 2017-06-06 DIAGNOSIS — Z87828 Personal history of other (healed) physical injury and trauma: Secondary | ICD-10-CM | POA: Diagnosis not present

## 2017-06-06 DIAGNOSIS — E119 Type 2 diabetes mellitus without complications: Secondary | ICD-10-CM | POA: Diagnosis not present

## 2017-06-06 DIAGNOSIS — S0100XA Unspecified open wound of scalp, initial encounter: Secondary | ICD-10-CM | POA: Diagnosis not present

## 2017-06-21 DIAGNOSIS — I10 Essential (primary) hypertension: Secondary | ICD-10-CM | POA: Diagnosis not present

## 2017-06-21 DIAGNOSIS — I480 Paroxysmal atrial fibrillation: Secondary | ICD-10-CM | POA: Diagnosis not present

## 2017-06-21 DIAGNOSIS — I4891 Unspecified atrial fibrillation: Secondary | ICD-10-CM | POA: Diagnosis not present

## 2017-06-21 DIAGNOSIS — D471 Chronic myeloproliferative disease: Secondary | ICD-10-CM | POA: Diagnosis not present

## 2017-06-21 DIAGNOSIS — E119 Type 2 diabetes mellitus without complications: Secondary | ICD-10-CM | POA: Diagnosis not present

## 2017-06-21 DIAGNOSIS — E0590 Thyrotoxicosis, unspecified without thyrotoxic crisis or storm: Secondary | ICD-10-CM | POA: Diagnosis not present

## 2017-07-05 DIAGNOSIS — I10 Essential (primary) hypertension: Secondary | ICD-10-CM | POA: Diagnosis not present

## 2017-07-05 DIAGNOSIS — E1143 Type 2 diabetes mellitus with diabetic autonomic (poly)neuropathy: Secondary | ICD-10-CM | POA: Diagnosis not present

## 2017-07-05 DIAGNOSIS — E039 Hypothyroidism, unspecified: Secondary | ICD-10-CM | POA: Diagnosis not present

## 2017-08-18 DIAGNOSIS — D471 Chronic myeloproliferative disease: Secondary | ICD-10-CM | POA: Diagnosis not present

## 2017-08-18 DIAGNOSIS — E119 Type 2 diabetes mellitus without complications: Secondary | ICD-10-CM | POA: Diagnosis not present

## 2017-08-18 DIAGNOSIS — I10 Essential (primary) hypertension: Secondary | ICD-10-CM | POA: Diagnosis not present

## 2017-08-18 DIAGNOSIS — E0590 Thyrotoxicosis, unspecified without thyrotoxic crisis or storm: Secondary | ICD-10-CM | POA: Diagnosis not present

## 2017-10-06 DIAGNOSIS — I10 Essential (primary) hypertension: Secondary | ICD-10-CM | POA: Diagnosis not present

## 2017-10-06 DIAGNOSIS — E1143 Type 2 diabetes mellitus with diabetic autonomic (poly)neuropathy: Secondary | ICD-10-CM | POA: Diagnosis not present

## 2017-10-06 DIAGNOSIS — E039 Hypothyroidism, unspecified: Secondary | ICD-10-CM | POA: Diagnosis not present

## 2017-11-15 DIAGNOSIS — Z23 Encounter for immunization: Secondary | ICD-10-CM | POA: Diagnosis not present

## 2017-12-25 DIAGNOSIS — D471 Chronic myeloproliferative disease: Secondary | ICD-10-CM | POA: Diagnosis not present

## 2017-12-25 DIAGNOSIS — E119 Type 2 diabetes mellitus without complications: Secondary | ICD-10-CM | POA: Diagnosis not present

## 2017-12-25 DIAGNOSIS — E0590 Thyrotoxicosis, unspecified without thyrotoxic crisis or storm: Secondary | ICD-10-CM | POA: Diagnosis not present

## 2017-12-25 DIAGNOSIS — I10 Essential (primary) hypertension: Secondary | ICD-10-CM | POA: Diagnosis not present

## 2017-12-26 DIAGNOSIS — J449 Chronic obstructive pulmonary disease, unspecified: Secondary | ICD-10-CM | POA: Diagnosis not present

## 2017-12-26 DIAGNOSIS — R2681 Unsteadiness on feet: Secondary | ICD-10-CM | POA: Diagnosis not present

## 2017-12-26 DIAGNOSIS — M6281 Muscle weakness (generalized): Secondary | ICD-10-CM | POA: Diagnosis not present

## 2017-12-27 DIAGNOSIS — R2681 Unsteadiness on feet: Secondary | ICD-10-CM | POA: Diagnosis not present

## 2017-12-27 DIAGNOSIS — M6281 Muscle weakness (generalized): Secondary | ICD-10-CM | POA: Diagnosis not present

## 2017-12-27 DIAGNOSIS — J449 Chronic obstructive pulmonary disease, unspecified: Secondary | ICD-10-CM | POA: Diagnosis not present

## 2017-12-29 DIAGNOSIS — M6281 Muscle weakness (generalized): Secondary | ICD-10-CM | POA: Diagnosis not present

## 2017-12-29 DIAGNOSIS — J449 Chronic obstructive pulmonary disease, unspecified: Secondary | ICD-10-CM | POA: Diagnosis not present

## 2017-12-29 DIAGNOSIS — R2681 Unsteadiness on feet: Secondary | ICD-10-CM | POA: Diagnosis not present

## 2018-01-01 DIAGNOSIS — M6281 Muscle weakness (generalized): Secondary | ICD-10-CM | POA: Diagnosis not present

## 2018-01-01 DIAGNOSIS — J449 Chronic obstructive pulmonary disease, unspecified: Secondary | ICD-10-CM | POA: Diagnosis not present

## 2018-01-01 DIAGNOSIS — R2681 Unsteadiness on feet: Secondary | ICD-10-CM | POA: Diagnosis not present

## 2018-01-02 DIAGNOSIS — M6281 Muscle weakness (generalized): Secondary | ICD-10-CM | POA: Diagnosis not present

## 2018-01-02 DIAGNOSIS — J449 Chronic obstructive pulmonary disease, unspecified: Secondary | ICD-10-CM | POA: Diagnosis not present

## 2018-01-02 DIAGNOSIS — R2681 Unsteadiness on feet: Secondary | ICD-10-CM | POA: Diagnosis not present

## 2018-01-03 DIAGNOSIS — R2681 Unsteadiness on feet: Secondary | ICD-10-CM | POA: Diagnosis not present

## 2018-01-03 DIAGNOSIS — J449 Chronic obstructive pulmonary disease, unspecified: Secondary | ICD-10-CM | POA: Diagnosis not present

## 2018-01-03 DIAGNOSIS — M6281 Muscle weakness (generalized): Secondary | ICD-10-CM | POA: Diagnosis not present

## 2018-01-04 DIAGNOSIS — J449 Chronic obstructive pulmonary disease, unspecified: Secondary | ICD-10-CM | POA: Diagnosis not present

## 2018-01-04 DIAGNOSIS — R2681 Unsteadiness on feet: Secondary | ICD-10-CM | POA: Diagnosis not present

## 2018-01-04 DIAGNOSIS — M6281 Muscle weakness (generalized): Secondary | ICD-10-CM | POA: Diagnosis not present

## 2018-01-08 DIAGNOSIS — R2681 Unsteadiness on feet: Secondary | ICD-10-CM | POA: Diagnosis not present

## 2018-01-08 DIAGNOSIS — M6281 Muscle weakness (generalized): Secondary | ICD-10-CM | POA: Diagnosis not present

## 2018-01-08 DIAGNOSIS — J449 Chronic obstructive pulmonary disease, unspecified: Secondary | ICD-10-CM | POA: Diagnosis not present

## 2018-01-09 DIAGNOSIS — J449 Chronic obstructive pulmonary disease, unspecified: Secondary | ICD-10-CM | POA: Diagnosis not present

## 2018-01-09 DIAGNOSIS — R2681 Unsteadiness on feet: Secondary | ICD-10-CM | POA: Diagnosis not present

## 2018-01-09 DIAGNOSIS — M6281 Muscle weakness (generalized): Secondary | ICD-10-CM | POA: Diagnosis not present

## 2018-01-10 DIAGNOSIS — J449 Chronic obstructive pulmonary disease, unspecified: Secondary | ICD-10-CM | POA: Diagnosis not present

## 2018-01-10 DIAGNOSIS — M6281 Muscle weakness (generalized): Secondary | ICD-10-CM | POA: Diagnosis not present

## 2018-01-10 DIAGNOSIS — R2681 Unsteadiness on feet: Secondary | ICD-10-CM | POA: Diagnosis not present

## 2018-01-11 DIAGNOSIS — M6281 Muscle weakness (generalized): Secondary | ICD-10-CM | POA: Diagnosis not present

## 2018-01-11 DIAGNOSIS — R2681 Unsteadiness on feet: Secondary | ICD-10-CM | POA: Diagnosis not present

## 2018-01-11 DIAGNOSIS — J449 Chronic obstructive pulmonary disease, unspecified: Secondary | ICD-10-CM | POA: Diagnosis not present

## 2018-01-15 DIAGNOSIS — J449 Chronic obstructive pulmonary disease, unspecified: Secondary | ICD-10-CM | POA: Diagnosis not present

## 2018-01-15 DIAGNOSIS — M6281 Muscle weakness (generalized): Secondary | ICD-10-CM | POA: Diagnosis not present

## 2018-01-15 DIAGNOSIS — R2681 Unsteadiness on feet: Secondary | ICD-10-CM | POA: Diagnosis not present

## 2018-01-16 DIAGNOSIS — M6281 Muscle weakness (generalized): Secondary | ICD-10-CM | POA: Diagnosis not present

## 2018-01-16 DIAGNOSIS — R2681 Unsteadiness on feet: Secondary | ICD-10-CM | POA: Diagnosis not present

## 2018-01-16 DIAGNOSIS — J449 Chronic obstructive pulmonary disease, unspecified: Secondary | ICD-10-CM | POA: Diagnosis not present

## 2018-01-17 DIAGNOSIS — R2681 Unsteadiness on feet: Secondary | ICD-10-CM | POA: Diagnosis not present

## 2018-01-17 DIAGNOSIS — J449 Chronic obstructive pulmonary disease, unspecified: Secondary | ICD-10-CM | POA: Diagnosis not present

## 2018-01-17 DIAGNOSIS — M6281 Muscle weakness (generalized): Secondary | ICD-10-CM | POA: Diagnosis not present

## 2018-01-18 DIAGNOSIS — I10 Essential (primary) hypertension: Secondary | ICD-10-CM | POA: Diagnosis not present

## 2018-01-18 DIAGNOSIS — J449 Chronic obstructive pulmonary disease, unspecified: Secondary | ICD-10-CM | POA: Diagnosis not present

## 2018-01-18 DIAGNOSIS — E039 Hypothyroidism, unspecified: Secondary | ICD-10-CM | POA: Diagnosis not present

## 2018-01-18 DIAGNOSIS — R2681 Unsteadiness on feet: Secondary | ICD-10-CM | POA: Diagnosis not present

## 2018-01-18 DIAGNOSIS — M6281 Muscle weakness (generalized): Secondary | ICD-10-CM | POA: Diagnosis not present

## 2018-01-18 DIAGNOSIS — E1143 Type 2 diabetes mellitus with diabetic autonomic (poly)neuropathy: Secondary | ICD-10-CM | POA: Diagnosis not present

## 2018-01-27 DIAGNOSIS — E0590 Thyrotoxicosis, unspecified without thyrotoxic crisis or storm: Secondary | ICD-10-CM | POA: Diagnosis not present

## 2018-01-27 DIAGNOSIS — D471 Chronic myeloproliferative disease: Secondary | ICD-10-CM | POA: Diagnosis not present

## 2018-01-27 DIAGNOSIS — E119 Type 2 diabetes mellitus without complications: Secondary | ICD-10-CM | POA: Diagnosis not present

## 2018-01-27 DIAGNOSIS — I10 Essential (primary) hypertension: Secondary | ICD-10-CM | POA: Diagnosis not present

## 2018-01-27 DIAGNOSIS — R05 Cough: Secondary | ICD-10-CM | POA: Diagnosis not present

## 2018-01-27 DIAGNOSIS — R509 Fever, unspecified: Secondary | ICD-10-CM | POA: Diagnosis not present

## 2018-02-19 DIAGNOSIS — D471 Chronic myeloproliferative disease: Secondary | ICD-10-CM | POA: Diagnosis not present

## 2018-02-19 DIAGNOSIS — E119 Type 2 diabetes mellitus without complications: Secondary | ICD-10-CM | POA: Diagnosis not present

## 2018-02-19 DIAGNOSIS — E0590 Thyrotoxicosis, unspecified without thyrotoxic crisis or storm: Secondary | ICD-10-CM | POA: Diagnosis not present

## 2018-02-19 DIAGNOSIS — I10 Essential (primary) hypertension: Secondary | ICD-10-CM | POA: Diagnosis not present

## 2018-03-01 DIAGNOSIS — Z0000 Encounter for general adult medical examination without abnormal findings: Secondary | ICD-10-CM | POA: Diagnosis not present

## 2018-04-13 DIAGNOSIS — M6281 Muscle weakness (generalized): Secondary | ICD-10-CM | POA: Diagnosis not present

## 2018-04-13 DIAGNOSIS — R2681 Unsteadiness on feet: Secondary | ICD-10-CM | POA: Diagnosis not present

## 2018-04-16 DIAGNOSIS — M6281 Muscle weakness (generalized): Secondary | ICD-10-CM | POA: Diagnosis not present

## 2018-04-16 DIAGNOSIS — R2681 Unsteadiness on feet: Secondary | ICD-10-CM | POA: Diagnosis not present

## 2018-04-17 DIAGNOSIS — R2681 Unsteadiness on feet: Secondary | ICD-10-CM | POA: Diagnosis not present

## 2018-04-17 DIAGNOSIS — M6281 Muscle weakness (generalized): Secondary | ICD-10-CM | POA: Diagnosis not present

## 2018-04-18 DIAGNOSIS — R2681 Unsteadiness on feet: Secondary | ICD-10-CM | POA: Diagnosis not present

## 2018-04-18 DIAGNOSIS — M6281 Muscle weakness (generalized): Secondary | ICD-10-CM | POA: Diagnosis not present

## 2018-04-19 DIAGNOSIS — E1143 Type 2 diabetes mellitus with diabetic autonomic (poly)neuropathy: Secondary | ICD-10-CM | POA: Diagnosis not present

## 2018-04-19 DIAGNOSIS — E039 Hypothyroidism, unspecified: Secondary | ICD-10-CM | POA: Diagnosis not present

## 2018-04-19 DIAGNOSIS — I10 Essential (primary) hypertension: Secondary | ICD-10-CM | POA: Diagnosis not present

## 2018-04-23 DIAGNOSIS — M6281 Muscle weakness (generalized): Secondary | ICD-10-CM | POA: Diagnosis not present

## 2018-04-23 DIAGNOSIS — R2681 Unsteadiness on feet: Secondary | ICD-10-CM | POA: Diagnosis not present

## 2018-04-24 DIAGNOSIS — M6281 Muscle weakness (generalized): Secondary | ICD-10-CM | POA: Diagnosis not present

## 2018-04-24 DIAGNOSIS — R2681 Unsteadiness on feet: Secondary | ICD-10-CM | POA: Diagnosis not present

## 2018-04-25 DIAGNOSIS — M6281 Muscle weakness (generalized): Secondary | ICD-10-CM | POA: Diagnosis not present

## 2018-04-25 DIAGNOSIS — R2681 Unsteadiness on feet: Secondary | ICD-10-CM | POA: Diagnosis not present

## 2018-04-26 DIAGNOSIS — R2681 Unsteadiness on feet: Secondary | ICD-10-CM | POA: Diagnosis not present

## 2018-04-26 DIAGNOSIS — M6281 Muscle weakness (generalized): Secondary | ICD-10-CM | POA: Diagnosis not present

## 2018-04-30 DIAGNOSIS — M6281 Muscle weakness (generalized): Secondary | ICD-10-CM | POA: Diagnosis not present

## 2018-04-30 DIAGNOSIS — R2681 Unsteadiness on feet: Secondary | ICD-10-CM | POA: Diagnosis not present

## 2018-05-01 DIAGNOSIS — M6281 Muscle weakness (generalized): Secondary | ICD-10-CM | POA: Diagnosis not present

## 2018-05-01 DIAGNOSIS — R2681 Unsteadiness on feet: Secondary | ICD-10-CM | POA: Diagnosis not present

## 2018-05-02 DIAGNOSIS — R41841 Cognitive communication deficit: Secondary | ICD-10-CM | POA: Diagnosis not present

## 2018-05-02 DIAGNOSIS — R2681 Unsteadiness on feet: Secondary | ICD-10-CM | POA: Diagnosis not present

## 2018-05-02 DIAGNOSIS — M6281 Muscle weakness (generalized): Secondary | ICD-10-CM | POA: Diagnosis not present

## 2018-05-03 DIAGNOSIS — M6281 Muscle weakness (generalized): Secondary | ICD-10-CM | POA: Diagnosis not present

## 2018-05-03 DIAGNOSIS — R2681 Unsteadiness on feet: Secondary | ICD-10-CM | POA: Diagnosis not present

## 2018-05-03 DIAGNOSIS — R41841 Cognitive communication deficit: Secondary | ICD-10-CM | POA: Diagnosis not present

## 2018-05-07 DIAGNOSIS — R2681 Unsteadiness on feet: Secondary | ICD-10-CM | POA: Diagnosis not present

## 2018-05-07 DIAGNOSIS — M6281 Muscle weakness (generalized): Secondary | ICD-10-CM | POA: Diagnosis not present

## 2018-05-07 DIAGNOSIS — R41841 Cognitive communication deficit: Secondary | ICD-10-CM | POA: Diagnosis not present

## 2018-05-08 DIAGNOSIS — R2681 Unsteadiness on feet: Secondary | ICD-10-CM | POA: Diagnosis not present

## 2018-05-08 DIAGNOSIS — M6281 Muscle weakness (generalized): Secondary | ICD-10-CM | POA: Diagnosis not present

## 2018-05-08 DIAGNOSIS — R41841 Cognitive communication deficit: Secondary | ICD-10-CM | POA: Diagnosis not present

## 2018-05-09 DIAGNOSIS — M6281 Muscle weakness (generalized): Secondary | ICD-10-CM | POA: Diagnosis not present

## 2018-05-09 DIAGNOSIS — R2681 Unsteadiness on feet: Secondary | ICD-10-CM | POA: Diagnosis not present

## 2018-05-09 DIAGNOSIS — R41841 Cognitive communication deficit: Secondary | ICD-10-CM | POA: Diagnosis not present

## 2018-05-10 DIAGNOSIS — M6281 Muscle weakness (generalized): Secondary | ICD-10-CM | POA: Diagnosis not present

## 2018-05-10 DIAGNOSIS — R2681 Unsteadiness on feet: Secondary | ICD-10-CM | POA: Diagnosis not present

## 2018-05-10 DIAGNOSIS — R41841 Cognitive communication deficit: Secondary | ICD-10-CM | POA: Diagnosis not present

## 2018-05-11 DIAGNOSIS — R41841 Cognitive communication deficit: Secondary | ICD-10-CM | POA: Diagnosis not present

## 2018-05-11 DIAGNOSIS — R2681 Unsteadiness on feet: Secondary | ICD-10-CM | POA: Diagnosis not present

## 2018-05-11 DIAGNOSIS — M6281 Muscle weakness (generalized): Secondary | ICD-10-CM | POA: Diagnosis not present

## 2018-05-14 DIAGNOSIS — E0590 Thyrotoxicosis, unspecified without thyrotoxic crisis or storm: Secondary | ICD-10-CM | POA: Diagnosis not present

## 2018-05-14 DIAGNOSIS — D471 Chronic myeloproliferative disease: Secondary | ICD-10-CM | POA: Diagnosis not present

## 2018-05-14 DIAGNOSIS — E119 Type 2 diabetes mellitus without complications: Secondary | ICD-10-CM | POA: Diagnosis not present

## 2018-05-14 DIAGNOSIS — I10 Essential (primary) hypertension: Secondary | ICD-10-CM | POA: Diagnosis not present

## 2018-05-15 DIAGNOSIS — M6281 Muscle weakness (generalized): Secondary | ICD-10-CM | POA: Diagnosis not present

## 2018-05-15 DIAGNOSIS — R41841 Cognitive communication deficit: Secondary | ICD-10-CM | POA: Diagnosis not present

## 2018-05-15 DIAGNOSIS — R2681 Unsteadiness on feet: Secondary | ICD-10-CM | POA: Diagnosis not present

## 2018-05-16 DIAGNOSIS — M6281 Muscle weakness (generalized): Secondary | ICD-10-CM | POA: Diagnosis not present

## 2018-05-16 DIAGNOSIS — R41841 Cognitive communication deficit: Secondary | ICD-10-CM | POA: Diagnosis not present

## 2018-05-16 DIAGNOSIS — R2681 Unsteadiness on feet: Secondary | ICD-10-CM | POA: Diagnosis not present

## 2018-05-17 DIAGNOSIS — M6281 Muscle weakness (generalized): Secondary | ICD-10-CM | POA: Diagnosis not present

## 2018-05-17 DIAGNOSIS — R41841 Cognitive communication deficit: Secondary | ICD-10-CM | POA: Diagnosis not present

## 2018-05-17 DIAGNOSIS — R2681 Unsteadiness on feet: Secondary | ICD-10-CM | POA: Diagnosis not present

## 2018-05-18 DIAGNOSIS — R2681 Unsteadiness on feet: Secondary | ICD-10-CM | POA: Diagnosis not present

## 2018-05-18 DIAGNOSIS — M6281 Muscle weakness (generalized): Secondary | ICD-10-CM | POA: Diagnosis not present

## 2018-05-18 DIAGNOSIS — R41841 Cognitive communication deficit: Secondary | ICD-10-CM | POA: Diagnosis not present

## 2018-05-21 DIAGNOSIS — M6281 Muscle weakness (generalized): Secondary | ICD-10-CM | POA: Diagnosis not present

## 2018-05-21 DIAGNOSIS — R41841 Cognitive communication deficit: Secondary | ICD-10-CM | POA: Diagnosis not present

## 2018-05-21 DIAGNOSIS — R2681 Unsteadiness on feet: Secondary | ICD-10-CM | POA: Diagnosis not present

## 2018-05-24 DIAGNOSIS — R2681 Unsteadiness on feet: Secondary | ICD-10-CM | POA: Diagnosis not present

## 2018-05-24 DIAGNOSIS — M6281 Muscle weakness (generalized): Secondary | ICD-10-CM | POA: Diagnosis not present

## 2018-05-24 DIAGNOSIS — R41841 Cognitive communication deficit: Secondary | ICD-10-CM | POA: Diagnosis not present

## 2018-05-28 DIAGNOSIS — R41841 Cognitive communication deficit: Secondary | ICD-10-CM | POA: Diagnosis not present

## 2018-05-28 DIAGNOSIS — R2681 Unsteadiness on feet: Secondary | ICD-10-CM | POA: Diagnosis not present

## 2018-05-28 DIAGNOSIS — M6281 Muscle weakness (generalized): Secondary | ICD-10-CM | POA: Diagnosis not present

## 2018-05-29 DIAGNOSIS — M6281 Muscle weakness (generalized): Secondary | ICD-10-CM | POA: Diagnosis not present

## 2018-05-29 DIAGNOSIS — R41841 Cognitive communication deficit: Secondary | ICD-10-CM | POA: Diagnosis not present

## 2018-05-29 DIAGNOSIS — R2681 Unsteadiness on feet: Secondary | ICD-10-CM | POA: Diagnosis not present

## 2018-05-30 DIAGNOSIS — R2681 Unsteadiness on feet: Secondary | ICD-10-CM | POA: Diagnosis not present

## 2018-05-30 DIAGNOSIS — R41841 Cognitive communication deficit: Secondary | ICD-10-CM | POA: Diagnosis not present

## 2018-05-30 DIAGNOSIS — M6281 Muscle weakness (generalized): Secondary | ICD-10-CM | POA: Diagnosis not present

## 2018-05-31 DIAGNOSIS — R41841 Cognitive communication deficit: Secondary | ICD-10-CM | POA: Diagnosis not present

## 2018-05-31 DIAGNOSIS — R2681 Unsteadiness on feet: Secondary | ICD-10-CM | POA: Diagnosis not present

## 2018-05-31 DIAGNOSIS — M6281 Muscle weakness (generalized): Secondary | ICD-10-CM | POA: Diagnosis not present

## 2018-06-04 DIAGNOSIS — M6281 Muscle weakness (generalized): Secondary | ICD-10-CM | POA: Diagnosis not present

## 2018-06-04 DIAGNOSIS — R41841 Cognitive communication deficit: Secondary | ICD-10-CM | POA: Diagnosis not present

## 2018-06-04 DIAGNOSIS — R2681 Unsteadiness on feet: Secondary | ICD-10-CM | POA: Diagnosis not present

## 2018-06-05 DIAGNOSIS — R2681 Unsteadiness on feet: Secondary | ICD-10-CM | POA: Diagnosis not present

## 2018-06-05 DIAGNOSIS — R41841 Cognitive communication deficit: Secondary | ICD-10-CM | POA: Diagnosis not present

## 2018-06-05 DIAGNOSIS — M6281 Muscle weakness (generalized): Secondary | ICD-10-CM | POA: Diagnosis not present

## 2018-06-05 DIAGNOSIS — N390 Urinary tract infection, site not specified: Secondary | ICD-10-CM | POA: Diagnosis not present

## 2018-06-06 DIAGNOSIS — M6281 Muscle weakness (generalized): Secondary | ICD-10-CM | POA: Diagnosis not present

## 2018-06-06 DIAGNOSIS — R2681 Unsteadiness on feet: Secondary | ICD-10-CM | POA: Diagnosis not present

## 2018-06-06 DIAGNOSIS — R41841 Cognitive communication deficit: Secondary | ICD-10-CM | POA: Diagnosis not present

## 2018-06-07 DIAGNOSIS — M6281 Muscle weakness (generalized): Secondary | ICD-10-CM | POA: Diagnosis not present

## 2018-06-07 DIAGNOSIS — R2681 Unsteadiness on feet: Secondary | ICD-10-CM | POA: Diagnosis not present

## 2018-06-07 DIAGNOSIS — R41841 Cognitive communication deficit: Secondary | ICD-10-CM | POA: Diagnosis not present

## 2018-06-11 DIAGNOSIS — R41841 Cognitive communication deficit: Secondary | ICD-10-CM | POA: Diagnosis not present

## 2018-06-11 DIAGNOSIS — M6281 Muscle weakness (generalized): Secondary | ICD-10-CM | POA: Diagnosis not present

## 2018-06-11 DIAGNOSIS — R2681 Unsteadiness on feet: Secondary | ICD-10-CM | POA: Diagnosis not present

## 2018-06-12 DIAGNOSIS — M6281 Muscle weakness (generalized): Secondary | ICD-10-CM | POA: Diagnosis not present

## 2018-06-12 DIAGNOSIS — R2681 Unsteadiness on feet: Secondary | ICD-10-CM | POA: Diagnosis not present

## 2018-06-12 DIAGNOSIS — R41841 Cognitive communication deficit: Secondary | ICD-10-CM | POA: Diagnosis not present

## 2018-06-13 DIAGNOSIS — R2681 Unsteadiness on feet: Secondary | ICD-10-CM | POA: Diagnosis not present

## 2018-06-13 DIAGNOSIS — M6281 Muscle weakness (generalized): Secondary | ICD-10-CM | POA: Diagnosis not present

## 2018-06-13 DIAGNOSIS — R41841 Cognitive communication deficit: Secondary | ICD-10-CM | POA: Diagnosis not present

## 2018-06-14 DIAGNOSIS — R2681 Unsteadiness on feet: Secondary | ICD-10-CM | POA: Diagnosis not present

## 2018-06-14 DIAGNOSIS — R41841 Cognitive communication deficit: Secondary | ICD-10-CM | POA: Diagnosis not present

## 2018-06-14 DIAGNOSIS — M6281 Muscle weakness (generalized): Secondary | ICD-10-CM | POA: Diagnosis not present

## 2018-06-18 DIAGNOSIS — R2681 Unsteadiness on feet: Secondary | ICD-10-CM | POA: Diagnosis not present

## 2018-06-18 DIAGNOSIS — M6281 Muscle weakness (generalized): Secondary | ICD-10-CM | POA: Diagnosis not present

## 2018-06-18 DIAGNOSIS — R41841 Cognitive communication deficit: Secondary | ICD-10-CM | POA: Diagnosis not present

## 2018-06-19 DIAGNOSIS — M6281 Muscle weakness (generalized): Secondary | ICD-10-CM | POA: Diagnosis not present

## 2018-06-19 DIAGNOSIS — R41841 Cognitive communication deficit: Secondary | ICD-10-CM | POA: Diagnosis not present

## 2018-06-19 DIAGNOSIS — R2681 Unsteadiness on feet: Secondary | ICD-10-CM | POA: Diagnosis not present

## 2018-06-20 DIAGNOSIS — R41841 Cognitive communication deficit: Secondary | ICD-10-CM | POA: Diagnosis not present

## 2018-06-20 DIAGNOSIS — R2681 Unsteadiness on feet: Secondary | ICD-10-CM | POA: Diagnosis not present

## 2018-06-20 DIAGNOSIS — M6281 Muscle weakness (generalized): Secondary | ICD-10-CM | POA: Diagnosis not present

## 2018-06-21 DIAGNOSIS — M6281 Muscle weakness (generalized): Secondary | ICD-10-CM | POA: Diagnosis not present

## 2018-06-21 DIAGNOSIS — R41841 Cognitive communication deficit: Secondary | ICD-10-CM | POA: Diagnosis not present

## 2018-06-21 DIAGNOSIS — R2681 Unsteadiness on feet: Secondary | ICD-10-CM | POA: Diagnosis not present

## 2018-06-26 DIAGNOSIS — R41841 Cognitive communication deficit: Secondary | ICD-10-CM | POA: Diagnosis not present

## 2018-06-26 DIAGNOSIS — R2681 Unsteadiness on feet: Secondary | ICD-10-CM | POA: Diagnosis not present

## 2018-06-26 DIAGNOSIS — M6281 Muscle weakness (generalized): Secondary | ICD-10-CM | POA: Diagnosis not present

## 2018-06-27 DIAGNOSIS — M6281 Muscle weakness (generalized): Secondary | ICD-10-CM | POA: Diagnosis not present

## 2018-06-27 DIAGNOSIS — R41841 Cognitive communication deficit: Secondary | ICD-10-CM | POA: Diagnosis not present

## 2018-06-27 DIAGNOSIS — R2681 Unsteadiness on feet: Secondary | ICD-10-CM | POA: Diagnosis not present

## 2018-06-28 DIAGNOSIS — M6281 Muscle weakness (generalized): Secondary | ICD-10-CM | POA: Diagnosis not present

## 2018-06-28 DIAGNOSIS — R41841 Cognitive communication deficit: Secondary | ICD-10-CM | POA: Diagnosis not present

## 2018-06-28 DIAGNOSIS — R2681 Unsteadiness on feet: Secondary | ICD-10-CM | POA: Diagnosis not present

## 2018-06-30 DIAGNOSIS — M6281 Muscle weakness (generalized): Secondary | ICD-10-CM | POA: Diagnosis not present

## 2018-06-30 DIAGNOSIS — R2681 Unsteadiness on feet: Secondary | ICD-10-CM | POA: Diagnosis not present

## 2018-06-30 DIAGNOSIS — R41841 Cognitive communication deficit: Secondary | ICD-10-CM | POA: Diagnosis not present

## 2018-07-02 DIAGNOSIS — E039 Hypothyroidism, unspecified: Secondary | ICD-10-CM | POA: Diagnosis not present

## 2018-07-02 DIAGNOSIS — E1143 Type 2 diabetes mellitus with diabetic autonomic (poly)neuropathy: Secondary | ICD-10-CM | POA: Diagnosis not present

## 2018-07-02 DIAGNOSIS — I10 Essential (primary) hypertension: Secondary | ICD-10-CM | POA: Diagnosis not present

## 2018-07-03 DIAGNOSIS — M6281 Muscle weakness (generalized): Secondary | ICD-10-CM | POA: Diagnosis not present

## 2018-07-03 DIAGNOSIS — R2681 Unsteadiness on feet: Secondary | ICD-10-CM | POA: Diagnosis not present

## 2018-07-04 DIAGNOSIS — R2681 Unsteadiness on feet: Secondary | ICD-10-CM | POA: Diagnosis not present

## 2018-07-04 DIAGNOSIS — M6281 Muscle weakness (generalized): Secondary | ICD-10-CM | POA: Diagnosis not present

## 2018-07-05 DIAGNOSIS — M6281 Muscle weakness (generalized): Secondary | ICD-10-CM | POA: Diagnosis not present

## 2018-07-05 DIAGNOSIS — R2681 Unsteadiness on feet: Secondary | ICD-10-CM | POA: Diagnosis not present

## 2018-07-06 DIAGNOSIS — M6281 Muscle weakness (generalized): Secondary | ICD-10-CM | POA: Diagnosis not present

## 2018-07-06 DIAGNOSIS — R2681 Unsteadiness on feet: Secondary | ICD-10-CM | POA: Diagnosis not present

## 2018-07-09 DIAGNOSIS — I10 Essential (primary) hypertension: Secondary | ICD-10-CM | POA: Diagnosis not present

## 2018-07-10 DIAGNOSIS — M6281 Muscle weakness (generalized): Secondary | ICD-10-CM | POA: Diagnosis not present

## 2018-07-10 DIAGNOSIS — R2681 Unsteadiness on feet: Secondary | ICD-10-CM | POA: Diagnosis not present

## 2018-07-11 DIAGNOSIS — R2681 Unsteadiness on feet: Secondary | ICD-10-CM | POA: Diagnosis not present

## 2018-07-11 DIAGNOSIS — M6281 Muscle weakness (generalized): Secondary | ICD-10-CM | POA: Diagnosis not present

## 2018-07-12 DIAGNOSIS — M6281 Muscle weakness (generalized): Secondary | ICD-10-CM | POA: Diagnosis not present

## 2018-07-12 DIAGNOSIS — R2681 Unsteadiness on feet: Secondary | ICD-10-CM | POA: Diagnosis not present

## 2018-07-16 DIAGNOSIS — M6281 Muscle weakness (generalized): Secondary | ICD-10-CM | POA: Diagnosis not present

## 2018-07-16 DIAGNOSIS — R2681 Unsteadiness on feet: Secondary | ICD-10-CM | POA: Diagnosis not present

## 2018-07-16 DIAGNOSIS — Z0000 Encounter for general adult medical examination without abnormal findings: Secondary | ICD-10-CM | POA: Diagnosis not present

## 2018-07-17 DIAGNOSIS — R2681 Unsteadiness on feet: Secondary | ICD-10-CM | POA: Diagnosis not present

## 2018-07-17 DIAGNOSIS — M6281 Muscle weakness (generalized): Secondary | ICD-10-CM | POA: Diagnosis not present

## 2018-08-11 DIAGNOSIS — M1612 Unilateral primary osteoarthritis, left hip: Secondary | ICD-10-CM | POA: Diagnosis not present

## 2018-08-11 DIAGNOSIS — M1712 Unilateral primary osteoarthritis, left knee: Secondary | ICD-10-CM | POA: Diagnosis not present

## 2018-08-12 DIAGNOSIS — N390 Urinary tract infection, site not specified: Secondary | ICD-10-CM | POA: Diagnosis not present

## 2018-08-12 DIAGNOSIS — J9610 Chronic respiratory failure, unspecified whether with hypoxia or hypercapnia: Secondary | ICD-10-CM | POA: Diagnosis not present

## 2018-08-13 DIAGNOSIS — E0590 Thyrotoxicosis, unspecified without thyrotoxic crisis or storm: Secondary | ICD-10-CM | POA: Diagnosis not present

## 2018-08-13 DIAGNOSIS — I10 Essential (primary) hypertension: Secondary | ICD-10-CM | POA: Diagnosis not present

## 2018-08-13 DIAGNOSIS — D471 Chronic myeloproliferative disease: Secondary | ICD-10-CM | POA: Diagnosis not present

## 2018-08-13 DIAGNOSIS — E119 Type 2 diabetes mellitus without complications: Secondary | ICD-10-CM | POA: Diagnosis not present

## 2018-08-14 DIAGNOSIS — G4751 Confusional arousals: Secondary | ICD-10-CM | POA: Diagnosis not present

## 2018-08-14 DIAGNOSIS — R634 Abnormal weight loss: Secondary | ICD-10-CM | POA: Diagnosis not present

## 2018-08-20 DIAGNOSIS — M6281 Muscle weakness (generalized): Secondary | ICD-10-CM | POA: Diagnosis not present

## 2018-08-20 DIAGNOSIS — R2681 Unsteadiness on feet: Secondary | ICD-10-CM | POA: Diagnosis not present

## 2018-08-20 DIAGNOSIS — Z9181 History of falling: Secondary | ICD-10-CM | POA: Diagnosis not present

## 2018-08-21 DIAGNOSIS — I4891 Unspecified atrial fibrillation: Secondary | ICD-10-CM | POA: Diagnosis not present

## 2018-08-21 DIAGNOSIS — N390 Urinary tract infection, site not specified: Secondary | ICD-10-CM | POA: Diagnosis not present

## 2018-08-21 DIAGNOSIS — F0390 Unspecified dementia without behavioral disturbance: Secondary | ICD-10-CM | POA: Diagnosis not present

## 2018-08-21 DIAGNOSIS — N3000 Acute cystitis without hematuria: Secondary | ICD-10-CM | POA: Diagnosis not present

## 2018-08-21 DIAGNOSIS — S32512A Fracture of superior rim of left pubis, initial encounter for closed fracture: Secondary | ICD-10-CM | POA: Diagnosis not present

## 2018-08-21 DIAGNOSIS — D72829 Elevated white blood cell count, unspecified: Secondary | ICD-10-CM | POA: Diagnosis not present

## 2018-08-21 DIAGNOSIS — J449 Chronic obstructive pulmonary disease, unspecified: Secondary | ICD-10-CM | POA: Diagnosis present

## 2018-08-21 DIAGNOSIS — Z9181 History of falling: Secondary | ICD-10-CM | POA: Diagnosis not present

## 2018-08-21 DIAGNOSIS — S32592A Other specified fracture of left pubis, initial encounter for closed fracture: Secondary | ICD-10-CM | POA: Diagnosis present

## 2018-08-21 DIAGNOSIS — S0990XA Unspecified injury of head, initial encounter: Secondary | ICD-10-CM | POA: Diagnosis not present

## 2018-08-21 DIAGNOSIS — S32599A Other specified fracture of unspecified pubis, initial encounter for closed fracture: Secondary | ICD-10-CM | POA: Diagnosis not present

## 2018-08-21 DIAGNOSIS — Z66 Do not resuscitate: Secondary | ICD-10-CM | POA: Diagnosis present

## 2018-08-21 DIAGNOSIS — M1990 Unspecified osteoarthritis, unspecified site: Secondary | ICD-10-CM | POA: Diagnosis present

## 2018-08-21 DIAGNOSIS — E876 Hypokalemia: Secondary | ICD-10-CM | POA: Diagnosis present

## 2018-08-21 DIAGNOSIS — C946 Myelodysplastic disease, not classified: Secondary | ICD-10-CM | POA: Diagnosis present

## 2018-08-21 DIAGNOSIS — E119 Type 2 diabetes mellitus without complications: Secondary | ICD-10-CM | POA: Diagnosis not present

## 2018-08-21 DIAGNOSIS — G9341 Metabolic encephalopathy: Secondary | ICD-10-CM | POA: Diagnosis not present

## 2018-08-21 DIAGNOSIS — M79652 Pain in left thigh: Secondary | ICD-10-CM | POA: Diagnosis not present

## 2018-08-21 DIAGNOSIS — S32519A Fracture of superior rim of unspecified pubis, initial encounter for closed fracture: Secondary | ICD-10-CM | POA: Diagnosis not present

## 2018-08-21 DIAGNOSIS — Z7902 Long term (current) use of antithrombotics/antiplatelets: Secondary | ICD-10-CM | POA: Diagnosis not present

## 2018-08-21 DIAGNOSIS — I10 Essential (primary) hypertension: Secondary | ICD-10-CM | POA: Diagnosis present

## 2018-08-21 DIAGNOSIS — W19XXXA Unspecified fall, initial encounter: Secondary | ICD-10-CM | POA: Diagnosis not present

## 2018-08-21 DIAGNOSIS — Z7901 Long term (current) use of anticoagulants: Secondary | ICD-10-CM | POA: Diagnosis not present

## 2018-08-21 DIAGNOSIS — D473 Essential (hemorrhagic) thrombocythemia: Secondary | ICD-10-CM | POA: Diagnosis not present

## 2018-08-21 DIAGNOSIS — I252 Old myocardial infarction: Secondary | ICD-10-CM | POA: Diagnosis not present

## 2018-08-21 DIAGNOSIS — Z79899 Other long term (current) drug therapy: Secondary | ICD-10-CM | POA: Diagnosis not present

## 2018-08-21 DIAGNOSIS — R531 Weakness: Secondary | ICD-10-CM | POA: Diagnosis not present

## 2018-08-21 DIAGNOSIS — E0590 Thyrotoxicosis, unspecified without thyrotoxic crisis or storm: Secondary | ICD-10-CM | POA: Diagnosis present

## 2018-08-21 DIAGNOSIS — S3210XA Unspecified fracture of sacrum, initial encounter for closed fracture: Secondary | ICD-10-CM | POA: Diagnosis present

## 2018-08-21 DIAGNOSIS — S3219XA Other fracture of sacrum, initial encounter for closed fracture: Secondary | ICD-10-CM | POA: Diagnosis not present

## 2018-08-21 DIAGNOSIS — R2681 Unsteadiness on feet: Secondary | ICD-10-CM | POA: Diagnosis not present

## 2018-08-21 DIAGNOSIS — N9489 Other specified conditions associated with female genital organs and menstrual cycle: Secondary | ICD-10-CM | POA: Diagnosis present

## 2018-08-21 DIAGNOSIS — M6281 Muscle weakness (generalized): Secondary | ICD-10-CM | POA: Diagnosis not present

## 2018-08-21 DIAGNOSIS — A419 Sepsis, unspecified organism: Secondary | ICD-10-CM | POA: Diagnosis not present

## 2018-08-26 DIAGNOSIS — D473 Essential (hemorrhagic) thrombocythemia: Secondary | ICD-10-CM

## 2018-08-27 ENCOUNTER — Encounter (HOSPITAL_COMMUNITY): Payer: Self-pay | Admitting: Emergency Medicine

## 2018-08-27 ENCOUNTER — Other Ambulatory Visit: Payer: Self-pay

## 2018-08-27 ENCOUNTER — Emergency Department (HOSPITAL_COMMUNITY): Payer: Medicare Other

## 2018-08-27 ENCOUNTER — Inpatient Hospital Stay (HOSPITAL_COMMUNITY)
Admission: EM | Admit: 2018-08-27 | Discharge: 2018-08-31 | DRG: 964 | Disposition: A | Discharge status: Skilled Nursing Facility | Payer: Medicare Other | Source: Other Acute Inpatient Hospital | Attending: General Surgery | Admitting: General Surgery

## 2018-08-27 DIAGNOSIS — I5020 Unspecified systolic (congestive) heart failure: Secondary | ICD-10-CM | POA: Diagnosis not present

## 2018-08-27 DIAGNOSIS — R0902 Hypoxemia: Secondary | ICD-10-CM | POA: Diagnosis not present

## 2018-08-27 DIAGNOSIS — Y92410 Unspecified street and highway as the place of occurrence of the external cause: Secondary | ICD-10-CM | POA: Diagnosis not present

## 2018-08-27 DIAGNOSIS — S59911A Unspecified injury of right forearm, initial encounter: Secondary | ICD-10-CM | POA: Diagnosis not present

## 2018-08-27 DIAGNOSIS — S3289XA Fracture of other parts of pelvis, initial encounter for closed fracture: Secondary | ICD-10-CM | POA: Diagnosis present

## 2018-08-27 DIAGNOSIS — Z87442 Personal history of urinary calculi: Secondary | ICD-10-CM

## 2018-08-27 DIAGNOSIS — D649 Anemia, unspecified: Secondary | ICD-10-CM | POA: Diagnosis not present

## 2018-08-27 DIAGNOSIS — R0602 Shortness of breath: Secondary | ICD-10-CM | POA: Diagnosis not present

## 2018-08-27 DIAGNOSIS — Z7901 Long term (current) use of anticoagulants: Secondary | ICD-10-CM | POA: Diagnosis not present

## 2018-08-27 DIAGNOSIS — E876 Hypokalemia: Secondary | ICD-10-CM | POA: Diagnosis not present

## 2018-08-27 DIAGNOSIS — E119 Type 2 diabetes mellitus without complications: Secondary | ICD-10-CM | POA: Diagnosis not present

## 2018-08-27 DIAGNOSIS — T07XXXA Unspecified multiple injuries, initial encounter: Secondary | ICD-10-CM | POA: Diagnosis not present

## 2018-08-27 DIAGNOSIS — N390 Urinary tract infection, site not specified: Secondary | ICD-10-CM | POA: Diagnosis not present

## 2018-08-27 DIAGNOSIS — I5022 Chronic systolic (congestive) heart failure: Secondary | ICD-10-CM | POA: Diagnosis present

## 2018-08-27 DIAGNOSIS — S32519A Fracture of superior rim of unspecified pubis, initial encounter for closed fracture: Secondary | ICD-10-CM | POA: Diagnosis not present

## 2018-08-27 DIAGNOSIS — I130 Hypertensive heart and chronic kidney disease with heart failure and stage 1 through stage 4 chronic kidney disease, or unspecified chronic kidney disease: Secondary | ICD-10-CM | POA: Diagnosis present

## 2018-08-27 DIAGNOSIS — Z882 Allergy status to sulfonamides status: Secondary | ICD-10-CM | POA: Diagnosis not present

## 2018-08-27 DIAGNOSIS — S3210XA Unspecified fracture of sacrum, initial encounter for closed fracture: Secondary | ICD-10-CM | POA: Diagnosis present

## 2018-08-27 DIAGNOSIS — Z79899 Other long term (current) drug therapy: Secondary | ICD-10-CM

## 2018-08-27 DIAGNOSIS — N9489 Other specified conditions associated with female genital organs and menstrual cycle: Secondary | ICD-10-CM | POA: Diagnosis present

## 2018-08-27 DIAGNOSIS — S300XXA Contusion of lower back and pelvis, initial encounter: Secondary | ICD-10-CM | POA: Diagnosis not present

## 2018-08-27 DIAGNOSIS — I10 Essential (primary) hypertension: Secondary | ICD-10-CM | POA: Diagnosis not present

## 2018-08-27 DIAGNOSIS — M1990 Unspecified osteoarthritis, unspecified site: Secondary | ICD-10-CM | POA: Diagnosis present

## 2018-08-27 DIAGNOSIS — I959 Hypotension, unspecified: Secondary | ICD-10-CM | POA: Diagnosis not present

## 2018-08-27 DIAGNOSIS — I480 Paroxysmal atrial fibrillation: Secondary | ICD-10-CM | POA: Diagnosis present

## 2018-08-27 DIAGNOSIS — S7002XA Contusion of left hip, initial encounter: Secondary | ICD-10-CM | POA: Diagnosis present

## 2018-08-27 DIAGNOSIS — Z20828 Contact with and (suspected) exposure to other viral communicable diseases: Secondary | ICD-10-CM | POA: Diagnosis present

## 2018-08-27 DIAGNOSIS — S329XXA Fracture of unspecified parts of lumbosacral spine and pelvis, initial encounter for closed fracture: Secondary | ICD-10-CM | POA: Diagnosis not present

## 2018-08-27 DIAGNOSIS — R6510 Systemic inflammatory response syndrome (SIRS) of non-infectious origin without acute organ dysfunction: Secondary | ICD-10-CM | POA: Diagnosis present

## 2018-08-27 DIAGNOSIS — R102 Pelvic and perineal pain: Secondary | ICD-10-CM | POA: Diagnosis not present

## 2018-08-27 DIAGNOSIS — Z66 Do not resuscitate: Secondary | ICD-10-CM | POA: Diagnosis present

## 2018-08-27 DIAGNOSIS — N183 Chronic kidney disease, stage 3 (moderate): Secondary | ICD-10-CM | POA: Diagnosis present

## 2018-08-27 DIAGNOSIS — F0390 Unspecified dementia without behavioral disturbance: Secondary | ICD-10-CM | POA: Diagnosis present

## 2018-08-27 DIAGNOSIS — E1122 Type 2 diabetes mellitus with diabetic chronic kidney disease: Secondary | ICD-10-CM | POA: Diagnosis present

## 2018-08-27 DIAGNOSIS — S32512A Fracture of superior rim of left pubis, initial encounter for closed fracture: Secondary | ICD-10-CM | POA: Diagnosis not present

## 2018-08-27 DIAGNOSIS — Z888 Allergy status to other drugs, medicaments and biological substances status: Secondary | ICD-10-CM | POA: Diagnosis not present

## 2018-08-27 DIAGNOSIS — R410 Disorientation, unspecified: Secondary | ICD-10-CM | POA: Diagnosis not present

## 2018-08-27 DIAGNOSIS — J449 Chronic obstructive pulmonary disease, unspecified: Secondary | ICD-10-CM | POA: Diagnosis present

## 2018-08-27 DIAGNOSIS — I4891 Unspecified atrial fibrillation: Secondary | ICD-10-CM | POA: Diagnosis not present

## 2018-08-27 DIAGNOSIS — Z8249 Family history of ischemic heart disease and other diseases of the circulatory system: Secondary | ICD-10-CM | POA: Diagnosis not present

## 2018-08-27 DIAGNOSIS — R339 Retention of urine, unspecified: Secondary | ICD-10-CM | POA: Diagnosis not present

## 2018-08-27 DIAGNOSIS — S3681XA Injury of peritoneum, initial encounter: Secondary | ICD-10-CM | POA: Diagnosis not present

## 2018-08-27 DIAGNOSIS — S3993XD Unspecified injury of pelvis, subsequent encounter: Secondary | ICD-10-CM | POA: Diagnosis not present

## 2018-08-27 DIAGNOSIS — M2550 Pain in unspecified joint: Secondary | ICD-10-CM | POA: Diagnosis not present

## 2018-08-27 DIAGNOSIS — D471 Chronic myeloproliferative disease: Secondary | ICD-10-CM | POA: Diagnosis not present

## 2018-08-27 DIAGNOSIS — M7989 Other specified soft tissue disorders: Secondary | ICD-10-CM | POA: Diagnosis not present

## 2018-08-27 DIAGNOSIS — S36114A Minor laceration of liver, initial encounter: Secondary | ICD-10-CM | POA: Diagnosis present

## 2018-08-27 DIAGNOSIS — D72829 Elevated white blood cell count, unspecified: Secondary | ICD-10-CM | POA: Diagnosis not present

## 2018-08-27 DIAGNOSIS — E0590 Thyrotoxicosis, unspecified without thyrotoxic crisis or storm: Secondary | ICD-10-CM | POA: Diagnosis present

## 2018-08-27 DIAGNOSIS — N179 Acute kidney failure, unspecified: Secondary | ICD-10-CM | POA: Diagnosis present

## 2018-08-27 DIAGNOSIS — S32599A Other specified fracture of unspecified pubis, initial encounter for closed fracture: Secondary | ICD-10-CM | POA: Diagnosis not present

## 2018-08-27 DIAGNOSIS — Z9049 Acquired absence of other specified parts of digestive tract: Secondary | ICD-10-CM | POA: Diagnosis not present

## 2018-08-27 DIAGNOSIS — E860 Dehydration: Secondary | ICD-10-CM | POA: Diagnosis present

## 2018-08-27 DIAGNOSIS — R079 Chest pain, unspecified: Secondary | ICD-10-CM | POA: Diagnosis not present

## 2018-08-27 DIAGNOSIS — I1310 Hypertensive heart and chronic kidney disease without heart failure, with stage 1 through stage 4 chronic kidney disease, or unspecified chronic kidney disease: Secondary | ICD-10-CM | POA: Diagnosis not present

## 2018-08-27 DIAGNOSIS — E785 Hyperlipidemia, unspecified: Secondary | ICD-10-CM | POA: Diagnosis present

## 2018-08-27 DIAGNOSIS — S199XXA Unspecified injury of neck, initial encounter: Secondary | ICD-10-CM | POA: Diagnosis not present

## 2018-08-27 DIAGNOSIS — D691 Qualitative platelet defects: Secondary | ICD-10-CM | POA: Diagnosis not present

## 2018-08-27 DIAGNOSIS — W19XXXA Unspecified fall, initial encounter: Secondary | ICD-10-CM | POA: Diagnosis present

## 2018-08-27 DIAGNOSIS — Z823 Family history of stroke: Secondary | ICD-10-CM

## 2018-08-27 DIAGNOSIS — I4510 Unspecified right bundle-branch block: Secondary | ICD-10-CM | POA: Diagnosis not present

## 2018-08-27 DIAGNOSIS — S299XXA Unspecified injury of thorax, initial encounter: Secondary | ICD-10-CM | POA: Diagnosis not present

## 2018-08-27 DIAGNOSIS — R17 Unspecified jaundice: Secondary | ICD-10-CM | POA: Diagnosis present

## 2018-08-27 DIAGNOSIS — M19041 Primary osteoarthritis, right hand: Secondary | ICD-10-CM | POA: Diagnosis not present

## 2018-08-27 DIAGNOSIS — D62 Acute posthemorrhagic anemia: Secondary | ICD-10-CM | POA: Diagnosis not present

## 2018-08-27 DIAGNOSIS — R9431 Abnormal electrocardiogram [ECG] [EKG]: Secondary | ICD-10-CM | POA: Diagnosis present

## 2018-08-27 DIAGNOSIS — E1165 Type 2 diabetes mellitus with hyperglycemia: Secondary | ICD-10-CM | POA: Diagnosis not present

## 2018-08-27 DIAGNOSIS — M25421 Effusion, right elbow: Secondary | ICD-10-CM | POA: Diagnosis not present

## 2018-08-27 DIAGNOSIS — Z7401 Bed confinement status: Secondary | ICD-10-CM | POA: Diagnosis not present

## 2018-08-27 DIAGNOSIS — S0990XA Unspecified injury of head, initial encounter: Secondary | ICD-10-CM | POA: Diagnosis not present

## 2018-08-27 DIAGNOSIS — S32592D Other specified fracture of left pubis, subsequent encounter for fracture with routine healing: Secondary | ICD-10-CM | POA: Diagnosis not present

## 2018-08-27 LAB — CBC WITH DIFFERENTIAL/PLATELET
Abs Immature Granulocytes: 0 10*3/uL (ref 0.00–0.07)
Basophils Absolute: 0 10*3/uL (ref 0.0–0.1)
Basophils Relative: 0 %
Eosinophils Absolute: 1.2 10*3/uL — ABNORMAL HIGH (ref 0.0–0.5)
Eosinophils Relative: 4 %
HCT: 41.5 % (ref 36.0–46.0)
Hemoglobin: 13.6 g/dL (ref 12.0–15.0)
Lymphocytes Relative: 2 %
Lymphs Abs: 0.6 10*3/uL — ABNORMAL LOW (ref 0.7–4.0)
MCH: 34.8 pg — ABNORMAL HIGH (ref 26.0–34.0)
MCHC: 32.8 g/dL (ref 30.0–36.0)
MCV: 106.1 fL — ABNORMAL HIGH (ref 80.0–100.0)
Monocytes Absolute: 0 10*3/uL — ABNORMAL LOW (ref 0.1–1.0)
Monocytes Relative: 0 %
Neutro Abs: 28.7 10*3/uL — ABNORMAL HIGH (ref 1.7–7.7)
Neutrophils Relative %: 94 %
Platelets: 782 10*3/uL — ABNORMAL HIGH (ref 150–400)
RBC: 3.91 MIL/uL (ref 3.87–5.11)
RDW: 20.4 % — ABNORMAL HIGH (ref 11.5–15.5)
WBC: 30.5 10*3/uL — ABNORMAL HIGH (ref 4.0–10.5)
nRBC: 0 /100 WBC
nRBC: 0.1 % (ref 0.0–0.2)

## 2018-08-27 LAB — COMPREHENSIVE METABOLIC PANEL
ALT: 12 U/L (ref 0–44)
AST: 26 U/L (ref 15–41)
Albumin: 2.8 g/dL — ABNORMAL LOW (ref 3.5–5.0)
Alkaline Phosphatase: 159 U/L — ABNORMAL HIGH (ref 38–126)
Anion gap: 11 (ref 5–15)
BUN: 11 mg/dL (ref 8–23)
CO2: 26 mmol/L (ref 22–32)
Calcium: 8.7 mg/dL — ABNORMAL LOW (ref 8.9–10.3)
Chloride: 102 mmol/L (ref 98–111)
Creatinine, Ser: 1.03 mg/dL — ABNORMAL HIGH (ref 0.44–1.00)
GFR calc Af Amer: 55 mL/min — ABNORMAL LOW (ref >60)
GFR calc non Af Amer: 47 mL/min — ABNORMAL LOW (ref >60)
Glucose, Bld: 141 mg/dL — ABNORMAL HIGH (ref 70–99)
Potassium: 3.9 mmol/L (ref 3.5–5.1)
Sodium: 139 mmol/L (ref 135–145)
Total Bilirubin: 1.4 mg/dL — ABNORMAL HIGH (ref 0.3–1.2)
Total Protein: 5.8 g/dL — ABNORMAL LOW (ref 6.5–8.1)

## 2018-08-27 LAB — PROTIME-INR
INR: 1.2 (ref 0.8–1.2)
Prothrombin Time: 14.9 seconds (ref 11.4–15.2)

## 2018-08-27 LAB — SARS CORONAVIRUS 2 BY RT PCR (HOSPITAL ORDER, PERFORMED IN ~~LOC~~ HOSPITAL LAB): SARS Coronavirus 2: NEGATIVE

## 2018-08-27 LAB — CBG MONITORING, ED: Glucose-Capillary: 163 mg/dL — ABNORMAL HIGH (ref 70–99)

## 2018-08-27 MED ORDER — IOHEXOL 300 MG/ML  SOLN
100.0000 mL | Freq: Once | INTRAMUSCULAR | Status: AC | PRN
  Administered 2018-08-27: 100 mL via INTRAVENOUS

## 2018-08-27 MED ORDER — ONDANSETRON 4 MG PO TBDP: 4.0000 mg | ORAL_TABLET | Freq: Four times a day (QID) | ORAL | Status: DC | PRN

## 2018-08-27 MED ORDER — INSULIN ASPART 100 UNIT/ML ~~LOC~~ SOLN
0.0000 [IU] | SUBCUTANEOUS | Status: DC
  Administered 2018-08-28 (×2): 1 [IU] via SUBCUTANEOUS
  Administered 2018-08-28: 2 [IU] via SUBCUTANEOUS
  Administered 2018-08-29: 1 [IU] via SUBCUTANEOUS
  Administered 2018-08-29 (×2): 2 [IU] via SUBCUTANEOUS
  Administered 2018-08-29 – 2018-08-30 (×7): 1 [IU] via SUBCUTANEOUS
  Administered 2018-08-31: 2 [IU] via SUBCUTANEOUS
  Administered 2018-08-31 (×3): 1 [IU] via SUBCUTANEOUS

## 2018-08-27 MED ORDER — MORPHINE SULFATE (PF) 2 MG/ML IV SOLN: 2.0000 mg | INTRAVENOUS | Status: DC | PRN

## 2018-08-27 MED ORDER — DOCUSATE SODIUM 100 MG PO CAPS
100.0000 mg | ORAL_CAPSULE | Freq: Two times a day (BID) | ORAL | Status: DC
  Administered 2018-08-28 – 2018-08-31 (×2): 100 mg via ORAL
  Filled 2018-08-27 (×6): qty 1

## 2018-08-27 MED ORDER — HYDRALAZINE HCL 20 MG/ML IJ SOLN: 10.0000 mg | INTRAMUSCULAR | Status: DC | PRN

## 2018-08-27 MED ORDER — OXYCODONE HCL 5 MG PO TABS
5.0000 mg | ORAL_TABLET | ORAL | Status: DC | PRN
  Administered 2018-08-28 – 2018-08-30 (×3): 5 mg via ORAL
  Filled 2018-08-27 (×3): qty 1

## 2018-08-27 MED ORDER — ACETAMINOPHEN 325 MG PO TABS
650.0000 mg | ORAL_TABLET | ORAL | Status: DC | PRN
  Administered 2018-08-29 – 2018-08-31 (×2): 650 mg via ORAL
  Filled 2018-08-27 (×2): qty 2

## 2018-08-27 MED ORDER — ONDANSETRON HCL 4 MG/2ML IJ SOLN: 4.0000 mg | Freq: Four times a day (QID) | INTRAMUSCULAR | Status: DC | PRN

## 2018-08-27 MED ORDER — METOPROLOL TARTRATE 5 MG/5ML IV SOLN: 5.0000 mg | Freq: Four times a day (QID) | INTRAVENOUS | Status: DC | PRN

## 2018-08-27 NOTE — ED Notes (Signed)
Pt placed on 2L [place] for stas of 90% when asleep.
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
## 2018-08-27 NOTE — ED Notes (Signed)
Pt back from XR. Purewick applied.

## 2018-08-27 NOTE — ED Notes (Signed)
Pat: [phone]

## 2018-08-27 NOTE — ED Notes (Signed)
Fraser Din updated by MD Ralene Bathe.

## 2018-08-27 NOTE — ED Notes (Addendum)
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  Tomorrow morning,   R     08/27/18 1927   08/27/18 2038  Calcium, ionized  Once,   STAT     08/27/18 2038   08/27/18 1737  Urinalysis, Routine w reflex microscopic  ONCE - STAT,   STAT     08/27/18 1737   Signed and Held  Hemoglobin A1c  Tomorrow morning,   R    Comments: To assess prior glycemic control    Signed and Held   Signed and Held  TSH  Tomorrow morning,   R     Signed and Held   Signed and Held  T4, free  Tomorrow morning,   R     Signed and Held   Signed and Held  T3  Tomorrow morning,   R     Signed and Held          Vitals/Pain Today's Vitals   08/27/18 2011 08/27/18 2030 08/27/18 2100 08/27/18 2122  BP: 127/75 140/68 134/62   Pulse:  84 86   Resp: (!) 23 (!) 23 (!) 23   Temp:      TempSrc:      SpO2:  99% 100%   PainSc:    0-No pain    Isolation Precautions No active isolations  Medications Medications  acetaminophen (TYLENOL) tablet 650 mg (has no administration in time range)  morphine 2 MG/ML injection 2-4 mg (has no administration in time  range)  docusate sodium (COLACE) capsule 100 mg (has no administration in time range)  oxyCODONE (Oxy IR/ROXICODONE) immediate release tablet 5 mg (has no administration in time range)  morphine 2 MG/ML injection 2  mg (has no administration in time range)  metoprolol tartrate (LOPRESSOR) injection 5 mg (has no administration in time range)  hydrALAZINE (APRESOLINE) injection 10 mg (has no administration in time range)  iohexol (OMNIPAQUE) 300 MG/ML solution 100 mL (100 mLs Intravenous Contrast Given 08/27/18 1651)    Mobility non-ambulatory High fall risk   Focused Assessments    R Recommendations: See Admitting Provider Note  Report given to:  Melanie RN  Additional Notes:
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
## 2018-08-27 NOTE — ED Notes (Addendum)
Lysle Rubens director: [phone] cell number  Facility phone: [phone]

## 2018-08-27 NOTE — ED Notes (Signed)
Pt returned from CT at this time.  

## 2018-08-27 NOTE — ED Notes (Addendum)
This RN spoke with daughter Fraser Din to give an update. I also spoke with the [place] facility.
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
## 2018-08-27 NOTE — ED Notes (Signed)
Nursing director at facility [facility] updated by this Rn about pt being admitted.

## 2018-08-27 NOTE — ED Notes (Signed)
Pt talking to daughter on phone
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
## 2018-08-28 LAB — GLUCOSE, CAPILLARY
Glucose-Capillary: 132 mg/dL — ABNORMAL HIGH (ref 70–99)
Glucose-Capillary: 147 mg/dL — ABNORMAL HIGH (ref 70–99)
Glucose-Capillary: 150 mg/dL — ABNORMAL HIGH (ref 70–99)
Glucose-Capillary: 163 mg/dL — ABNORMAL HIGH (ref 70–99)
Glucose-Capillary: 94 mg/dL (ref 70–99)

## 2018-08-28 LAB — CBC
HCT: 36.4 % (ref 36.0–46.0)
Hemoglobin: 11.7 g/dL — ABNORMAL LOW (ref 12.0–15.0)
MCH: 34.6 pg — ABNORMAL HIGH (ref 26.0–34.0)
MCHC: 32.1 g/dL (ref 30.0–36.0)
MCV: 107.7 fL — ABNORMAL HIGH (ref 80.0–100.0)
Platelets: 745 10*3/uL — ABNORMAL HIGH (ref 150–400)
RBC: 3.38 MIL/uL — ABNORMAL LOW (ref 3.87–5.11)
RDW: 20.5 % — ABNORMAL HIGH (ref 11.5–15.5)
WBC: 29 10*3/uL — ABNORMAL HIGH (ref 4.0–10.5)
nRBC: 0 % (ref 0.0–0.2)

## 2018-08-28 LAB — URINALYSIS, ROUTINE W REFLEX MICROSCOPIC
Bilirubin Urine: NEGATIVE
Glucose, UA: NEGATIVE mg/dL
Hgb urine dipstick: NEGATIVE
Ketones, ur: 5 mg/dL — AB
Leukocytes,Ua: NEGATIVE
Nitrite: NEGATIVE
Protein, ur: NEGATIVE mg/dL
Specific Gravity, Urine: >1.046 — ABNORMAL HIGH (ref 1.005–1.030)
pH: 5 (ref 5.0–8.0)

## 2018-08-28 LAB — BASIC METABOLIC PANEL
Anion gap: 9 (ref 5–15)
BUN: 12 mg/dL (ref 8–23)
CO2: 28 mmol/L (ref 22–32)
Calcium: 8.4 mg/dL — ABNORMAL LOW (ref 8.9–10.3)
Chloride: 103 mmol/L (ref 98–111)
Creatinine, Ser: 1.06 mg/dL — ABNORMAL HIGH (ref 0.44–1.00)
GFR calc Af Amer: 53 mL/min — ABNORMAL LOW (ref >60)
GFR calc non Af Amer: 46 mL/min — ABNORMAL LOW (ref >60)
Glucose, Bld: 168 mg/dL — ABNORMAL HIGH (ref 70–99)
Potassium: 3.8 mmol/L (ref 3.5–5.1)
Sodium: 140 mmol/L (ref 135–145)

## 2018-08-28 LAB — T4, FREE: Free T4: 1.25 ng/dL — ABNORMAL HIGH (ref 0.61–1.12)

## 2018-08-28 LAB — HEMOGLOBIN A1C
Hgb A1c MFr Bld: 6.6 % — ABNORMAL HIGH (ref 4.8–5.6)
Mean Plasma Glucose: 142.72 mg/dL

## 2018-08-28 LAB — MRSA PCR SCREENING: MRSA by PCR: NEGATIVE

## 2018-08-28 LAB — PREALBUMIN: Prealbumin: 11.7 mg/dL — ABNORMAL LOW (ref 18–38)

## 2018-08-28 LAB — TSH: TSH: 3.112 u[IU]/mL (ref 0.350–4.500)

## 2018-08-28 MED ORDER — BETHANECHOL CHLORIDE 25 MG PO TABS
25.0000 mg | ORAL_TABLET | Freq: Three times a day (TID) | ORAL | Status: DC
  Administered 2018-08-28 – 2018-08-31 (×10): 25 mg via ORAL
  Filled 2018-08-28 (×11): qty 1

## 2018-08-28 MED ORDER — TAMSULOSIN HCL 0.4 MG PO CAPS
0.4000 mg | ORAL_CAPSULE | Freq: Every day | ORAL | Status: DC
  Administered 2018-08-28 – 2018-08-30 (×3): 0.4 mg via ORAL
  Filled 2018-08-28 (×3): qty 1
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
## 2018-08-28 NOTE — Progress Notes (Signed)
In and out Cath done. Patient tol. Well only one attempt was made and received 300 ml dk ambur urine. Patient did c/o of it burning. Urine sent for U.A. as previous ordered.
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
## 2018-08-28 NOTE — Progress Notes (Signed)
Called to update pt daughter. All questions were answered. Will continue to monitor.  Amanda Cockayne, RN
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
## 2018-08-28 NOTE — Progress Notes (Signed)
Spoke with Fraser Din (patients daughter) and updated her that she had a bath, got up to the bedside commode, and her eating. Tech expressed to daughter that patient wasn't eating well ( had 3 bites of breakfast and refused lunch) but informed tech that she hasn't been eating well in the last month, and wouldn't eat well at [facility] as well. Daughter did not express too much concern over pt eating, but thankful I called. Will make second attempt to feed when she is ready.

## 2018-08-29 LAB — CALCIUM, IONIZED: Calcium, Ionized, Serum: 4.6 mg/dL (ref 4.5–5.6)

## 2018-08-29 LAB — BASIC METABOLIC PANEL
Anion gap: 12 (ref 5–15)
BUN: 21 mg/dL (ref 8–23)
CO2: 25 mmol/L (ref 22–32)
Calcium: 8.2 mg/dL — ABNORMAL LOW (ref 8.9–10.3)
Chloride: 103 mmol/L (ref 98–111)
Creatinine, Ser: 1.83 mg/dL — ABNORMAL HIGH (ref 0.44–1.00)
GFR calc Af Amer: 27 mL/min — ABNORMAL LOW (ref >60)
GFR calc non Af Amer: 24 mL/min — ABNORMAL LOW (ref >60)
Glucose, Bld: 167 mg/dL — ABNORMAL HIGH (ref 70–99)
Potassium: 3.5 mmol/L (ref 3.5–5.1)
Sodium: 140 mmol/L (ref 135–145)

## 2018-08-29 LAB — CBC
HCT: 33.1 % — ABNORMAL LOW (ref 36.0–46.0)
Hemoglobin: 10.6 g/dL — ABNORMAL LOW (ref 12.0–15.0)
MCH: 35.2 pg — ABNORMAL HIGH (ref 26.0–34.0)
MCHC: 32 g/dL (ref 30.0–36.0)
MCV: 110 fL — ABNORMAL HIGH (ref 80.0–100.0)
Platelets: 614 10*3/uL — ABNORMAL HIGH (ref 150–400)
RBC: 3.01 MIL/uL — ABNORMAL LOW (ref 3.87–5.11)
RDW: 21.3 % — ABNORMAL HIGH (ref 11.5–15.5)
WBC: 28.5 10*3/uL — ABNORMAL HIGH (ref 4.0–10.5)
nRBC: 0.1 % (ref 0.0–0.2)

## 2018-08-29 LAB — GLUCOSE, CAPILLARY
Glucose-Capillary: 115 mg/dL — ABNORMAL HIGH (ref 70–99)
Glucose-Capillary: 137 mg/dL — ABNORMAL HIGH (ref 70–99)
Glucose-Capillary: 138 mg/dL — ABNORMAL HIGH (ref 70–99)
Glucose-Capillary: 143 mg/dL — ABNORMAL HIGH (ref 70–99)
Glucose-Capillary: 144 mg/dL — ABNORMAL HIGH (ref 70–99)
Glucose-Capillary: 151 mg/dL — ABNORMAL HIGH (ref 70–99)
Glucose-Capillary: 152 mg/dL — ABNORMAL HIGH (ref 70–99)

## 2018-08-29 MED ORDER — LACTATED RINGERS IV SOLN
INTRAVENOUS | Status: DC
  Administered 2018-08-29 – 2018-08-30 (×2): via INTRAVENOUS

## 2018-08-29 MED ORDER — SODIUM CHLORIDE 0.9 % IV BOLUS
1000.0000 mL | Freq: Once | INTRAVENOUS | Status: AC
  Administered 2018-08-29: 06:00:00 1000 mL via INTRAVENOUS

## 2018-08-29 NOTE — Plan of Care (Signed)
Poc progressing.  

## 2018-08-29 NOTE — Progress Notes (Signed)
CSW spoke with the patient's daughter, Fraser Din, and she requested  address change from  the ALF address of [address] [place] , [place] [address] to her mailing address- [place], [place] [address].    Thurmond Butts, MSW, [place] Social Worker [phone]

## 2018-08-29 NOTE — Progress Notes (Signed)
PROGRESS CONSULT NOTE  Bridget Torres TJQ:[id number] DOB: 26-Jan-1926 DOA: 08/27/2018 PCP: Bridget Sheriff, MD       HPI/Recap of past 24 hours: 85 y.o. female with medical history significant of paroxysmal A.fib, anticoagulation on Eliquis, history of congestive heart failure, diabetes mellitus type 2, HTN, hyperthyroidism, dementia  TRH consulted  for medical management.  08/29/18: Patient was seen and examined at her bedside this morning she also alert and confused in the setting of dementia.  No complaints of pain.  Bruising apparent in her left/right hip and suprapubic region.  Hypotensive this morning with map of 55, oliguric with worsening renal function.  Given 1 L of normal saline bolus and started on maintenance fluids LR at 75 cc/h.   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  Hypotension unclear etiology Hypotensive this morning with map of 55 Received IV fluid boluses and was started on IV fluid Closely monitor volume status TSH normal on 08/28/2018 Maintain map greater than 65  AKI on CKD 3 likely prerenal secondary to dehydration Creatinine at baseline 1.03 with GFR 47 Creatinine this morning 1.8 with GFR of 24 Start IV fluid hydration Received 1 L normal saline bolus Start LR at 75 cc/h Monitor urine output Obtain BMP in the morning  Acute blood loss anemia in the setting of trauma  Hemoglobin at baseline 13 Hemoglobin trending down Hemoglobin on 08/29/2018 was 10.6 BUN elevated 31 from 12 on presentation Consider repeating CT abdomen and pelvis if hemoglobin continues to drop Obtain iron studies  Physical debility/ambulatory dysfunction PT assessed and recommended SNF CSW consulted for placement Fall precautions  Type 2 diabetes with hyperglycemia Last hemoglobin A1c 6.6 Continue insulin sliding scale Avoid hypoglycemia  Dementia with no behavioral disturbance Reorient as needed Fall precautions  Urinary retention Continue bethanechol and tamsulosin Continue to monitor urine output  Elevated alkaline phosphatase likely secondary to recent trauma Repeat level in the morning  Hyperbilirubinemia Total bilirubin 1.4 Monitor repeat level No sign of overt bleeding at this time  Thrombocytemia Platelet greater than 700K Anticoagulant and antiplatelet on hold due to hematoma  Pelvic hematoma in female -continue to hold off on anticoagulation and antiplatelets  . Hyperthyroidism -continue methimazole  . Chronic systolic heart failure (HCC) -currently appears to be euvolemic.  Strict I's and O's and daily weight.  Net I&O -175 cc since admission . Paroxysmal atrial fibrillation (HCC) -         - CHA2DS2 vas score 4 :  Not on anticoagulation secondary to Risk of Falls  recurrent bleeding         -  Rate control: Continue metoprolol IV        - Rhythm control:  Continue amiodarone when able to tolerate when no longer n.p.o.  . Persistent Prolonged QT interval  QTC 526 personally reviewed, repeated twelve-lead EKG done on 08/29/2018 showed QTC 514 Continue avoid QTC prolonging drugs Repeat twelve-lead EKG in the morning Continue to monitor on  telemetry   Thank you for allowing [name] to participate in the care of this patient.  We will continue to follow with you.   Objective: Vitals:   08/29/18 0015 08/29/18 0410 08/29/18 0749 08/29/18 1338  BP: (!)  96/33 (!) 95/36 (!) 117/43 (!) 122/54  Pulse: 86 84  80  Resp: 18 18  15   Temp: 97.6 F (36.4 C) 97.7 F (36.5 C) (!) 97.5 F (36.4 C) 97.7 F (36.5 C)  TempSrc: Oral Axillary Axillary Axillary  SpO2: 96% 99% 100% 100%  Weight:        Intake/Output Summary (Last 24 hours) at 08/29/2018 1433 Last data filed at 08/29/2018 0400 Gross per 24 hour  Intake 150 ml  Output 25 ml  Net 125 ml   Filed Weights   08/27/18 2247  Weight: 49.9 kg    Exam:  . General: 85 y.o. year-old female well-nourished in no acute distress.  Alert and pleasantly demented.   . Cardiovascular: Regular rate and rhythm no rubs or gallops no JVD or thyromegaly noted.   Marland Kitchen Respiratory: Clear to auscultation no wheezes no rales.  Poor inspiratory effort.  Abdomen: Soft nondistended normal bowel sounds present.   . Musculoskeletal: No edema noted in left and right pubis and suprapubic region. Marland Kitchen Psychiatry: Mood is appropriate for condition and setting.  Data Reviewed: CBC: Recent Labs  Lab 08/27/18 1533 08/28/18 0023 08/29/18 0412  WBC 30.5* 29.0* 28.5*  NEUTROABS 28.7*  --   --   HGB 13.6 11.7* 10.6*  HCT 41.5 36.4 33.1*  MCV 106.1* 107.7* 110.0*  PLT 782* 745* 166*   Basic Metabolic Panel: Recent Labs  Lab 08/27/18 1533 08/28/18 0023 08/29/18 0412  NA 139 140 140  K 3.9 3.8 3.5  CL 102 103 103  CO2 26 28 25   GLUCOSE 141* 168* 167*  BUN 11 12 21   CREATININE 1.03* 1.06* 1.83*  CALCIUM 8.7* 8.4* 8.2*   GFR: CrCl cannot be calculated (Unknown ideal weight.). Liver Function Tests: Recent Labs  Lab 08/27/18 1533  AST 26  ALT 12  ALKPHOS 159*  BILITOT 1.4*  PROT 5.8*  ALBUMIN 2.8*   No results for input(s): LIPASE, AMYLASE in the last 168 hours. No results for input(s): AMMONIA in the last 168 hours. Coagulation Profile: Recent Labs  Lab 08/27/18 1533  INR 1.2   Cardiac Enzymes: No results for input(s): CKTOTAL, CKMB, CKMBINDEX, TROPONINI in the last 168 hours. BNP (last 3  results) No results for input(s): PROBNP in the last 8760 hours. HbA1C: Recent Labs    08/28/18 0023  HGBA1C 6.6*   CBG: Recent Labs  Lab 08/28/18 1701 08/28/18 1939 08/29/18 0010 08/29/18 0408 08/29/18 1148  GLUCAP 115* 150* 151* 152* 138*   Lipid Profile: No results for input(s): CHOL, HDL, LDLCALC, TRIG, CHOLHDL, LDLDIRECT in the last 72 hours. Thyroid Function Tests: Recent Labs    08/28/18 0023  TSH 3.112  FREET4 1.25*   Anemia Panel: No results for input(s): VITAMINB12, FOLATE, FERRITIN, TIBC, IRON, RETICCTPCT in the last 72 hours. Urine analysis:    Component Value Date/Time   COLORURINE YELLOW 08/28/2018 [place] 08/28/2018 0704   LABSPEC >1.046 (H) 08/28/2018 0704   PHURINE 5.0 08/28/2018 0704   GLUCOSEU NEGATIVE 08/28/2018 0704   HGBUR NEGATIVE 08/28/2018 0704   BILIRUBINUR NEGATIVE 08/28/2018 0704   KETONESUR 5 (A) 08/28/2018 0704   PROTEINUR NEGATIVE 08/28/2018 0704   NITRITE NEGATIVE 08/28/2018 0704   LEUKOCYTESUR NEGATIVE 08/28/2018 0704  Sepsis Labs: @LABRCNTIP (procalcitonin:4,lacticidven:4)  ) Recent Results (from the past 240 hour(s))  SARS Coronavirus 2 (CEPHEID - Performed in [place] hospital lab), Hosp Order     Status: None   Collection Time: 08/27/18  6:29 PM   Specimen: Nasopharyngeal Swab  Result Value Ref Range Status   SARS Coronavirus 2 NEGATIVE NEGATIVE Final    Comment: (NOTE) If result is NEGATIVE SARS-CoV-2 target nucleic acids are NOT DETECTED. The SARS-CoV-2 RNA is generally detectable in upper and lower  respiratory specimens during the acute phase of infection. The lowest  concentration of SARS-CoV-2 viral copies this assay can detect is 250  copies / mL. A negative result does not preclude SARS-CoV-2 infection  and should not be used as the sole basis for treatment or other  patient management decisions.  A negative result may occur with  improper specimen collection / handling, submission of  specimen other  than nasopharyngeal swab, presence of viral mutation(s) within the  areas targeted by this assay, and inadequate number of viral copies  (<250 copies / mL). A negative result must be combined with clinical  observations, patient history, and epidemiological information. If result is POSITIVE SARS-CoV-2 target nucleic acids are DETECTED. The SARS-CoV-2 RNA is generally detectable in upper and lower  respiratory specimens dur ing the acute phase of infection.  Positive  results are indicative of active infection with SARS-CoV-2.  Clinical  correlation with patient history and other diagnostic information is  necessary to determine patient infection status.  Positive results do  not rule out bacterial infection or co-infection with other viruses. If result is PRESUMPTIVE POSTIVE SARS-CoV-2 nucleic acids MAY BE PRESENT.   A presumptive positive result was obtained on the submitted specimen  and confirmed on repeat testing.  While 2019 novel coronavirus  (SARS-CoV-2) nucleic acids may be present in the submitted sample  additional confirmatory testing may be necessary for epidemiological  and / or clinical management purposes  to differentiate between  SARS-CoV-2 and other Sarbecovirus currently known to infect humans.  If clinically indicated additional testing with an alternate test  methodology [id number]) is advised. The SARS-CoV-2 RNA is generally  detectable in upper and lower respiratory sp ecimens during the acute  phase of infection. The expected result is Negative. Fact Sheet for Patients:  StrictlyIdeas.no Fact Sheet for Healthcare Providers: BankingDealers.co.za This test is not yet approved or cleared by the [place] FDA and has been authorized for detection and/or diagnosis of SARS-CoV-2 by FDA under an Emergency Use Authorization (EUA).  This EUA will remain in effect (meaning this test can be used) for the  duration of the COVID-19 declaration under Section 564(b)(1) of the Act, 21 U.S.C. section 360bbb-3(b)(1), unless the authorization is terminated or revoked sooner. Performed at [facility], [place] [address], [place], [place] [address]   MRSA PCR Screening     Status: None   Collection Time: 08/28/18  4:17 AM   Specimen: Nasal Mucosa; Nasopharyngeal  Result Value Ref Range Status   MRSA by PCR NEGATIVE NEGATIVE Final    Comment:        The GeneXpert MRSA Assay (FDA approved for NASAL specimens only), is one component of a comprehensive MRSA colonization surveillance program. It is not intended to diagnose MRSA infection nor to guide or monitor treatment for MRSA infections. Performed at [facility], [place] [address], [place], [place] [address]       Studies: No results found.  Scheduled Meds: . bethanechol  25 mg Oral TID  . docusate sodium  100 mg Oral BID  . insulin aspart  0-9 Units Subcutaneous Q4H  . tamsulosin  0.4 mg Oral QPC supper    Continuous Infusions: . lactated ringers 75 mL/hr at 08/29/18 0752     LOS: 2 days     Kayleen Memos, MD [facility] Pager [phone]  If 7PM-7AM, please contact night-coverage [email or web address] Password [email or web address] 08/29/2018, 2:33 PM
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
## 2018-08-29 NOTE — Progress Notes (Signed)
Pt has not voided all night long. Bladder scab showed 145 cc. Pt says she doesn't feel pressure or urge to void. Pt wanted some gingerale to drink. But after taking two shiped started coughing. Pt's gown and bed sheet were wet due to sweating. Pt cleaned, gown and linen changed. Pur-wick d/ced. Call bell within reach. Will continue to monitor.

## 2018-08-29 NOTE — NC FL2 (Signed)
[place] LEVEL OF CARE SCREENING TOOL     IDENTIFICATION  Patient Name: Bridget Torres Birthdate: August 19, 1925 Sex: female Admission Date (Current Location): 08/27/2018  [facility] and [place] Number:  Herbalist and Address:  [facility], [place] [address], [place], [place] [address]      Provider Number: [id number]  Attending Physician Name and Address:  Md, Trauma, MD  Relative Name and Phone Number:  Heron Nay (daughter) [phone]    Current Level of Care: Hospital Recommended Level of Care: [place] Prior Approval Number:    Date Approved/Denied:   PASRR Number: [id number]  Discharge Plan: SNF    Current Diagnoses: Patient Active Problem List   Diagnosis Date Noted  . Pelvic hematoma in female 08/27/2018  . Prolonged QT interval 08/27/2018  . Leukocytosis 08/27/2018  . On amiodarone therapy 12/26/2016  . Mitral regurgitation 12/16/2016  . Elevated troponin 12/16/2016  . Chronic anticoagulation 12/16/2016  . 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 Kidney stones   . Hyperthyroidism   . Hypertensive heart disease with heart failure ([place])   . Diverticulosis   . Arthritis   . Anemia   . Pleural effusion   . Hypokalemia   . Diabetes mellitus ([place])   . Chronic systolic heart failure ([place])   . Paroxysmal atrial fibrillation ([facility])     Orientation RESPIRATION BLADDER Height & Weight     Self, Time  Normal Incontinent Weight: 49.9 kg Height:     BEHAVIORAL SYMPTOMS/MOOD NEUROLOGICAL BOWEL NUTRITION STATUS      Incontinent Diet(soft carb modified)  AMBULATORY STATUS COMMUNICATION OF NEEDS Skin   Extensive Assist Verbally Skin abrasions, Bruising(multiple skin abrasions and ecchymotic areas all over)                       Personal Care Assistance Level of Assistance  Bathing, Feeding, Dressing Bathing Assistance: Maximum assistance Feeding assistance: Limited assistance Dressing Assistance: Maximum  assistance     Functional Limitations Info  Sight, Hearing Sight Info: Impaired Hearing Info: Impaired      SPECIAL CARE FACTORS FREQUENCY  PT (By licensed PT), OT (By licensed OT)     PT Frequency: 5-6 times weekly OT Frequency: 5-6 times weekly            Contractures Contractures Info: Not present    Additional Factors Info  Code Status, Insulin Sliding Scale, Allergies Code Status Info: DNR Allergies Info: Percogesic diphenhydramine-acetaminophen(reaction not specified); Percogesic phenyltoloxamine-acetaminophen (reaction not specified); Sulfa antibiotics-rash   Insulin Sliding Scale Info: Novolog insulin 0-9 units SQ q4h       Current Medications (08/29/2018):  This is the current hospital active medication list Current Facility-Administered Medications  Medication Dose Route Frequency Provider Last Rate Last Dose  . acetaminophen (TYLENOL) tablet 650 mg  650 mg Oral Q4H PRN Kinsinger, Arta Bruce, MD      . bethanechol (URECHOLINE) tablet 25 mg  25 mg Oral TID Georganna Skeans, MD   25 mg at 08/29/18 1107  . docusate sodium (COLACE) capsule 100 mg  100 mg Oral BID Kinsinger, Arta Bruce, MD   100 mg at 08/28/18 0903  . hydrALAZINE (APRESOLINE) injection 10 mg  10 mg Intravenous Q2H PRN Kinsinger, Arta Bruce, MD      . insulin aspart (novoLOG) injection 0-9 Units  0-9 Units Subcutaneous Q4H Toy Baker, MD   1 Units at 08/29/18 1157  . lactated ringers infusion   Intravenous Continuous Irene Pap  N, DO 75 mL/hr at 08/29/18 0752    . metoprolol tartrate (LOPRESSOR) injection 5 mg  5 mg Intravenous Q6H PRN Kinsinger, Arta Bruce, MD      . morphine 2 MG/ML injection 2 mg  2 mg Intravenous Q2H PRN Kinsinger, Arta Bruce, MD      . morphine 2 MG/ML injection 2-4 mg  2-4 mg Intravenous Q1H PRN Kinsinger, Arta Bruce, MD      . oxyCODONE (Oxy IR/ROXICODONE) immediate release tablet 5 mg  5 mg Oral Q4H PRN Kinsinger, Arta Bruce, MD   5 mg at 08/28/18 2136  . tamsulosin  (FLOMAX) capsule 0.4 mg  0.4 mg Oral QPC supper Georganna Skeans, MD   0.4 mg at 08/28/18 1805     Discharge Medications: Please see discharge summary for a list of discharge medications.  Relevant Imaging Results:  Relevant Lab Results:   Additional Information [id number]    Reinaldo Raddle, RN, BSN  Trauma/Neuro ICU Case Manager [phone]
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
## 2018-08-30 LAB — BASIC METABOLIC PANEL
Anion gap: 12 (ref 5–15)
BUN: 25 mg/dL — ABNORMAL HIGH (ref 8–23)
CO2: 22 mmol/L (ref 22–32)
Calcium: 7.8 mg/dL — ABNORMAL LOW (ref 8.9–10.3)
Chloride: 104 mmol/L (ref 98–111)
Creatinine, Ser: 2.02 mg/dL — ABNORMAL HIGH (ref 0.44–1.00)
GFR calc Af Amer: 24 mL/min — ABNORMAL LOW (ref >60)
GFR calc non Af Amer: 21 mL/min — ABNORMAL LOW (ref >60)
Glucose, Bld: 144 mg/dL — ABNORMAL HIGH (ref 70–99)
Potassium: 3.7 mmol/L (ref 3.5–5.1)
Sodium: 138 mmol/L (ref 135–145)

## 2018-08-30 LAB — RETICULOCYTES
Immature Retic Fract: 38.4 % — ABNORMAL HIGH (ref 2.3–15.9)
RBC.: 2.43 MIL/uL — ABNORMAL LOW (ref 3.87–5.11)
Retic Count, Absolute: 138.8 10*3/uL (ref 19.0–186.0)
Retic Ct Pct: 5.7 % — ABNORMAL HIGH (ref 0.4–3.1)

## 2018-08-30 LAB — CBC WITH DIFFERENTIAL/PLATELET
Abs Immature Granulocytes: 0.77 10*3/uL — ABNORMAL HIGH (ref 0.00–0.07)
Basophils Absolute: 0.1 10*3/uL (ref 0.0–0.1)
Basophils Relative: 1 %
Eosinophils Absolute: 0.5 10*3/uL (ref 0.0–0.5)
Eosinophils Relative: 2 %
HCT: 28.4 % — ABNORMAL LOW (ref 36.0–46.0)
Hemoglobin: 8.9 g/dL — ABNORMAL LOW (ref 12.0–15.0)
Immature Granulocytes: 3 %
Lymphocytes Relative: 5 %
Lymphs Abs: 1.2 10*3/uL (ref 0.7–4.0)
MCH: 35 pg — ABNORMAL HIGH (ref 26.0–34.0)
MCHC: 31.3 g/dL (ref 30.0–36.0)
MCV: 111.8 fL — ABNORMAL HIGH (ref 80.0–100.0)
Monocytes Absolute: 0.9 10*3/uL (ref 0.1–1.0)
Monocytes Relative: 4 %
Neutro Abs: 19 10*3/uL — ABNORMAL HIGH (ref 1.7–7.7)
Neutrophils Relative %: 85 %
Platelets: 578 10*3/uL — ABNORMAL HIGH (ref 150–400)
RBC: 2.54 MIL/uL — ABNORMAL LOW (ref 3.87–5.11)
RDW: 21.4 % — ABNORMAL HIGH (ref 11.5–15.5)
WBC: 22.5 10*3/uL — ABNORMAL HIGH (ref 4.0–10.5)
nRBC: 0.1 % (ref 0.0–0.2)

## 2018-08-30 LAB — GLUCOSE, CAPILLARY
Glucose-Capillary: 148 mg/dL — ABNORMAL HIGH (ref 70–99)
Glucose-Capillary: 131 mg/dL — ABNORMAL HIGH (ref 70–99)
Glucose-Capillary: 121 mg/dL — ABNORMAL HIGH (ref 70–99)
Glucose-Capillary: 107 mg/dL — ABNORMAL HIGH (ref 70–99)
Glucose-Capillary: 130 mg/dL — ABNORMAL HIGH (ref 70–99)

## 2018-08-30 LAB — FERRITIN: Ferritin: 178 ng/mL (ref 11–307)

## 2018-08-30 LAB — IRON AND TIBC
Iron: 81 ug/dL (ref 28–170)
Saturation Ratios: 38 % — ABNORMAL HIGH (ref 10.4–31.8)
TIBC: 216 ug/dL — ABNORMAL LOW (ref 250–450)
UIBC: 135 ug/dL

## 2018-08-30 LAB — PROCALCITONIN: Procalcitonin: 0.19 ng/mL

## 2018-08-30 LAB — T3: T3, Total: 68 ng/dL — ABNORMAL LOW (ref 71–180)

## 2018-08-30 LAB — BRAIN NATRIURETIC PEPTIDE: B Natriuretic Peptide: 217.2 pg/mL — ABNORMAL HIGH (ref 0.0–100.0)

## 2018-08-30 MED ORDER — METOPROLOL SUCCINATE ER 25 MG PO TB24
12.5000 mg | ORAL_TABLET | Freq: Every day | ORAL | Status: DC
  Administered 2018-08-31: 12.5 mg via ORAL
  Filled 2018-08-30: qty 1

## 2018-08-30 MED ORDER — MELATONIN 3 MG PO TABS
3.0000 mg | ORAL_TABLET | Freq: Every evening | ORAL | Status: DC | PRN
  Filled 2018-08-30: qty 1

## 2018-08-30 MED ORDER — CALCIUM CARBONATE-VITAMIN D 500-200 MG-UNIT PO TABS
1.0000 | ORAL_TABLET | Freq: Every day | ORAL | Status: DC
  Administered 2018-08-31: 1 via ORAL
  Filled 2018-08-30: qty 1

## 2018-08-30 MED ORDER — FERROUS SULFATE 325 (65 FE) MG PO TABS
325.0000 mg | ORAL_TABLET | Freq: Every day | ORAL | Status: DC
  Administered 2018-08-31: 325 mg via ORAL
  Filled 2018-08-30: qty 1

## 2018-08-30 MED ORDER — CALCIUM CARB-CHOLECALCIFEROL 500-400 MG-UNIT PO TABS: ORAL_TABLET | Freq: Every day | ORAL | Status: DC

## 2018-08-30 MED ORDER — AMIODARONE HCL 200 MG PO TABS
200.0000 mg | ORAL_TABLET | Freq: Every day | ORAL | Status: DC
  Administered 2018-08-30 – 2018-08-31 (×2): 200 mg via ORAL
  Filled 2018-08-30 (×2): qty 1

## 2018-08-30 MED ORDER — METOPROLOL SUCCINATE ER 25 MG PO TB24: 25.0000 mg | ORAL_TABLET | Freq: Every day | ORAL | Status: DC

## 2018-08-30 NOTE — Progress Notes (Signed)
PROGRESS CONSULT NOTE  Bridget Torres JSE:[id number] DOB: Nov 23, 1925 DOA: 08/27/2018 PCP: [name], MD       HPI/Recap of past 24 hours: 85 y.o. female with medical history significant of paroxysmal A.fib, anticoagulation on Eliquis, history of congestive heart failure, diabetes mellitus type 2, HTN, hyperthyroidism, dementia  TRH consulted  for medical management.  08/30/18: Patient was seen and examined at her bedside. She denies any pain. She is demented and unable to provide a reliable history. Oliguric per nursing staff with nearly no urine from bladder scan overnight. CT abd pelvis showed intact kidney with no hydronephrosis. Drop in Hg noted, increase BUN and worsening creatinine despite IV fluid hydration.     Assessment/Plan: Active Problems:   Thrombocytasthenia ([place])   Hyperthyroidism   Diabetes mellitus ([place])   Chronic systolic heart failure (HCC)   Paroxysmal atrial fibrillation (HCC)   Chronic anticoagulation   Pelvic hematoma in female   Prolonged QT interval   Leukocytosis  Resolving SIRS with no clear source of infection T-max 100.4 on 08/28/2018, leukocytosis WBC 22K from 29K from 30K, respiratory rate 24 UA negative Chest x-ray and CT chest personally reviewed and showed no lobular infiltrates SIRS, this could be inflammatory Procalcitonin unremarkable.  Worsening AKI on CKD 3 likely multifactorial prerenal in the setting of dehydration vs others dehydration Creatinine at baseline 1.03 with GFR 47 Creatinine this morning 2.02 with GFR of 21 Continue gentle IV fluid hydration lactated ringer 75 cc/h Closely monitor urine output and obtain bladder scan Daily BMPs until creatinine improves  Acute blood loss anemia in the setting of pelvic fracture post trauma Hemoglobin at baseline 13.6 on 08/27/18 Hemoglobin continues to trend down Hemoglobin 8.9 on 08/30/2018, may have a hemodilution component. Recommend repeating CT abdomen and pelvis to  rule out worsening hematoma or bleeding.  Acute Urinary retention Continue bethanechol and tamsulosin for acute urinary retention  Closely monitor Bladder scan as needed  Resolved Hypotension post IV fluid BP is normotensive TSH normal on 08/28/2018 Continue to Maintain map greater than 65  Type 2 diabetes with hyperglycemia Last hemoglobin A1c 6.6 Continue insulin sliding scale Avoid hypoglycemia CBGs are stable  Dementia with no behavioral disturbance Reorient as needed Fall precautions  Elevated alkaline phosphatase likely secondary to recent trauma Repeat level in the morning  Hyperbilirubinemia Total bilirubin 1.4 Monitor repeat level No sign of overt bleeding at this time  Thrombocytemia Presented with platelet greater than 700K Levels are trending down with IV fluid. Anticoagulant and antiplatelet on hold due to hematoma Hydroxyurea on hold which can worsen her anemia.  Pelvic hematoma in female -continue to hold off on anticoagulation and antiplatelets  . Hyperthyroidism -continue methimazole  . Chronic systolic heart failure (HCC) -currently appears to be euvolemic.  Strict I's and O's and daily weight.  Net I&O -175 cc since admission Will add on BNP; no record of recent 2D echo. . Paroxysmal atrial fibrillation (HCC) -         - CHA2DS2 vas score 4 :  Not on anticoagulation secondary to Risk of Falls  recurrent bleeding         -  Rate control: Resume lower dose of home toprol xl tomorrow due to soft BP.        - Rhythm control: Resume amiodarone  . Persistent Prolonged QT interval  QTC 526 personally reviewed, repeated twelve-lead EKG done on 08/29/2018 showed QTC 514 Continue to avoid QTC prolonging drugs Repeat twelve-lead EKG in the morning Continue  to monitor on telemetry   Thank you for allowing [name] to participate in the care of this patient.  We will continue to follow with you.   Objective: Vitals:   08/29/18 2320 08/30/18 0240 08/30/18 0412  08/30/18 0736  BP: (!) 116/41  (!) 130/55 (!) 123/52  Pulse: 90 93 (!) 101 100  Resp: 17 16 16 18   Temp: (!) 96.7 F (35.9 C) 97.9 F (36.6 C) 98.2 F (36.8 C) 97.7 F (36.5 C)  TempSrc: Rectal Axillary Oral Axillary  SpO2: 99% 94% 93% 96%  Weight:        Intake/Output Summary (Last 24 hours) at 08/30/2018 0948 Last data filed at 08/29/2018 1500 Gross per 24 hour  Intake 219.39 ml  Output -  Net 219.39 ml   Filed Weights   08/27/18 2247  Weight: 49.9 kg    Exam:  . General: 85 y.o. year-old female well-developed well-nourished.  Alert in the setting of dementia.   . Cardiovascular: Regular rate and rhythm no rubs or gallops.  No JVD or thyromegaly. Marland Kitchen Respiratory: Clear to auscultation no wheezes or rales.  Poor inspiratory effort.  \ . Abdomen: Soft nontender nondistended normal bowel sounds present. . Musculoskeletal: Trace edema in lower extremities. Marland Kitchen Psychiatry: Mood is appropriate for condition and setting.  Data Reviewed: CBC: Recent Labs  Lab 08/27/18 1533 08/28/18 0023 08/29/18 0412 08/30/18 0305  WBC 30.5* 29.0* 28.5* 22.5*  NEUTROABS 28.7*  --   --  19.0*  HGB 13.6 11.7* 10.6* 8.9*  HCT 41.5 36.4 33.1* 28.4*  MCV 106.1* 107.7* 110.0* 111.8*  PLT 782* 745* 614* 254*   Basic Metabolic Panel: Recent Labs  Lab 08/27/18 1533 08/28/18 0023 08/29/18 0412 08/30/18 0700  NA 139 140 140 138  K 3.9 3.8 3.5 3.7  CL 102 103 103 104  CO2 26 28 25 22   GLUCOSE 141* 168* 167* 144*  BUN 11 12 21  25*  CREATININE 1.03* 1.06* 1.83* 2.02*  CALCIUM 8.7* 8.4* 8.2* 7.8*   GFR: CrCl cannot be calculated (Unknown ideal weight.). Liver Function Tests: Recent Labs  Lab 08/27/18 1533  AST 26  ALT 12  ALKPHOS 159*  BILITOT 1.4*  PROT 5.8*  ALBUMIN 2.8*   No results for input(s): LIPASE, AMYLASE in the last 168 hours. No results for input(s): AMMONIA in the last 168 hours. Coagulation Profile: Recent Labs  Lab 08/27/18 1533  INR 1.2   Cardiac Enzymes:  No results for input(s): CKTOTAL, CKMB, CKMBINDEX, TROPONINI in the last 168 hours. BNP (last 3 results) No results for input(s): PROBNP in the last 8760 hours. HbA1C: Recent Labs    08/28/18 0023  HGBA1C 6.6*   CBG: Recent Labs  Lab 08/29/18 1706 08/29/18 1958 08/29/18 2346 08/30/18 0413 08/30/18 0817  GLUCAP 144* 137* 143* 148* 131*   Lipid Profile: No results for input(s): CHOL, HDL, LDLCALC, TRIG, CHOLHDL, LDLDIRECT in the last 72 hours. Thyroid Function Tests: Recent Labs    08/28/18 0023  TSH 3.112  FREET4 1.25*   Anemia Panel: No results for input(s): VITAMINB12, FOLATE, FERRITIN, TIBC, IRON, RETICCTPCT in the last 72 hours. Urine analysis:    Component Value Date/Time   COLORURINE YELLOW 08/28/2018 0704   APPEARANCEUR CLEAR 08/28/2018 0704   LABSPEC >1.046 (H) 08/28/2018 0704   PHURINE 5.0 08/28/2018 0704   GLUCOSEU NEGATIVE 08/28/2018 0704   HGBUR NEGATIVE 08/28/2018 0704   BILIRUBINUR NEGATIVE 08/28/2018 0704   KETONESUR 5 (A) 08/28/2018 0704   PROTEINUR NEGATIVE 08/28/2018 2706  NITRITE NEGATIVE 08/28/2018 0704   LEUKOCYTESUR NEGATIVE 08/28/2018 0704   Sepsis Labs: @LABRCNTIP (procalcitonin:4,lacticidven:4)  ) Recent Results (from the past 240 hour(s))  SARS Coronavirus 2 (CEPHEID - Performed in [place] hospital lab), Hosp Order     Status: None   Collection Time: 08/27/18  6:29 PM   Specimen: Nasopharyngeal Swab  Result Value Ref Range Status   SARS Coronavirus 2 NEGATIVE NEGATIVE Final    Comment: (NOTE) If result is NEGATIVE SARS-CoV-2 target nucleic acids are NOT DETECTED. The SARS-CoV-2 RNA is generally detectable in upper and lower  respiratory specimens during the acute phase of infection. The lowest  concentration of SARS-CoV-2 viral copies this assay can detect is 250  copies / mL. A negative result does not preclude SARS-CoV-2 infection  and should not be used as the sole basis for treatment or other  patient management  decisions.  A negative result may occur with  improper specimen collection / handling, submission of specimen other  than nasopharyngeal swab, presence of viral mutation(s) within the  areas targeted by this assay, and inadequate number of viral copies  (<250 copies / mL). A negative result must be combined with clinical  observations, patient history, and epidemiological information. If result is POSITIVE SARS-CoV-2 target nucleic acids are DETECTED. The SARS-CoV-2 RNA is generally detectable in upper and lower  respiratory specimens dur ing the acute phase of infection.  Positive  results are indicative of active infection with SARS-CoV-2.  Clinical  correlation with patient history and other diagnostic information is  necessary to determine patient infection status.  Positive results do  not rule out bacterial infection or co-infection with other viruses. If result is PRESUMPTIVE POSTIVE SARS-CoV-2 nucleic acids MAY BE PRESENT.   A presumptive positive result was obtained on the submitted specimen  and confirmed on repeat testing.  While 2019 novel coronavirus  (SARS-CoV-2) nucleic acids may be present in the submitted sample  additional confirmatory testing may be necessary for epidemiological  and / or clinical management purposes  to differentiate between  SARS-CoV-2 and other Sarbecovirus currently known to infect humans.  If clinically indicated additional testing with an alternate test  methodology [phone]) is advised. The SARS-CoV-2 RNA is generally  detectable in upper and lower respiratory sp ecimens during the acute  phase of infection. The expected result is Negative. Fact Sheet for Patients:  StrictlyIdeas.no Fact Sheet for Healthcare Providers: BankingDealers.co.za This test is not yet approved or cleared by the [place] FDA and has been authorized for detection and/or diagnosis of SARS-CoV-2 by FDA under an  Emergency Use Authorization (EUA).  This EUA will remain in effect (meaning this test can be used) for the duration of the COVID-19 declaration under Section 564(b)(1) of the Act, 21 U.S.C. section 360bbb-3(b)(1), unless the authorization is terminated or revoked sooner. Performed at [facility], [place] [address] [place], [place] [address]   MRSA PCR Screening     Status: None   Collection Time: 08/28/18  4:17 AM   Specimen: Nasal Mucosa; Nasopharyngeal  Result Value Ref Range Status   MRSA by PCR NEGATIVE NEGATIVE Final    Comment:        The GeneXpert MRSA Assay (FDA approved for NASAL specimens only), is one component of a comprehensive MRSA colonization surveillance program. It is not intended to diagnose MRSA infection nor to guide or monitor treatment for MRSA infections. Performed at [facility], [place] [address], [place], [place] [address]  Studies: No results found.  Scheduled Meds: . bethanechol  25 mg Oral TID  . docusate sodium  100 mg Oral BID  . insulin aspart  0-9 Units Subcutaneous Q4H  . tamsulosin  0.4 mg Oral QPC supper    Continuous Infusions: . lactated ringers 75 mL/hr at 08/29/18 0752     LOS: 3 days     Kayleen Memos, MD [facility] Pager [phone]  If 7PM-7AM, please contact night-coverage [email or web address] Password [email or web address] 08/30/2018, 9:48 AM

## 2018-08-30 NOTE — Care Management Important Message (Signed)
Important Message  Patient Details  Name: Bridget Torres MRN: [id number] Date of Birth: 1925/05/01   Medicare Important Message Given:  Yes     Shelda Altes 08/30/2018, 12:39 PM
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
## 2018-08-30 NOTE — Progress Notes (Signed)
Updated daughter via phone.  Clyde Canterbury, RN

## 2018-08-30 NOTE — Care Management (Addendum)
Updated, pt's daughter, Heron Nay, by phone regarding current condition, and plan to keep in hospital another day to monitor renal function.   Hopeful for dc to SNF on 7/31 pending improvement in kidney function.  [facility] SNF has been updated, and is able to accept pt on 7/31 pending medical stability.    Reinaldo Raddle, RN, BSN  Trauma/Neuro ICU Case Manager [phone]  Addendum:   Per Mayra Reel in admissions at [facility], pt does not need a repeat COVID test.    Reinaldo Raddle, RN, BSN  Trauma/Neuro ICU Case Manager [phone]

## 2018-08-31 ENCOUNTER — Encounter: Payer: Self-pay | Admitting: Student

## 2018-08-31 DIAGNOSIS — S329XXD Fracture of unspecified parts of lumbosacral spine and pelvis, subsequent encounter for fracture with routine healing: Secondary | ICD-10-CM | POA: Diagnosis not present

## 2018-08-31 DIAGNOSIS — S3289XD Fracture of other parts of pelvis, subsequent encounter for fracture with routine healing: Secondary | ICD-10-CM | POA: Diagnosis not present

## 2018-08-31 DIAGNOSIS — R0602 Shortness of breath: Secondary | ICD-10-CM | POA: Diagnosis not present

## 2018-08-31 DIAGNOSIS — T07XXXA Unspecified multiple injuries, initial encounter: Secondary | ICD-10-CM | POA: Diagnosis not present

## 2018-08-31 DIAGNOSIS — D471 Chronic myeloproliferative disease: Secondary | ICD-10-CM | POA: Diagnosis not present

## 2018-08-31 DIAGNOSIS — R410 Disorientation, unspecified: Secondary | ICD-10-CM | POA: Diagnosis not present

## 2018-08-31 DIAGNOSIS — D649 Anemia, unspecified: Secondary | ICD-10-CM | POA: Diagnosis not present

## 2018-08-31 DIAGNOSIS — D72829 Elevated white blood cell count, unspecified: Secondary | ICD-10-CM | POA: Diagnosis not present

## 2018-08-31 DIAGNOSIS — S3681XA Injury of peritoneum, initial encounter: Secondary | ICD-10-CM | POA: Diagnosis not present

## 2018-08-31 DIAGNOSIS — N390 Urinary tract infection, site not specified: Secondary | ICD-10-CM | POA: Diagnosis not present

## 2018-08-31 DIAGNOSIS — I1310 Hypertensive heart and chronic kidney disease without heart failure, with stage 1 through stage 4 chronic kidney disease, or unspecified chronic kidney disease: Secondary | ICD-10-CM | POA: Diagnosis not present

## 2018-08-31 DIAGNOSIS — R339 Retention of urine, unspecified: Secondary | ICD-10-CM | POA: Diagnosis not present

## 2018-08-31 DIAGNOSIS — D529 Folate deficiency anemia, unspecified: Secondary | ICD-10-CM | POA: Diagnosis not present

## 2018-08-31 DIAGNOSIS — S36114A Minor laceration of liver, initial encounter: Secondary | ICD-10-CM | POA: Diagnosis not present

## 2018-08-31 DIAGNOSIS — M2550 Pain in unspecified joint: Secondary | ICD-10-CM | POA: Diagnosis not present

## 2018-08-31 DIAGNOSIS — I4891 Unspecified atrial fibrillation: Secondary | ICD-10-CM | POA: Diagnosis not present

## 2018-08-31 DIAGNOSIS — E119 Type 2 diabetes mellitus without complications: Secondary | ICD-10-CM | POA: Diagnosis not present

## 2018-08-31 DIAGNOSIS — R634 Abnormal weight loss: Secondary | ICD-10-CM | POA: Diagnosis not present

## 2018-08-31 DIAGNOSIS — I5020 Unspecified systolic (congestive) heart failure: Secondary | ICD-10-CM | POA: Diagnosis not present

## 2018-08-31 DIAGNOSIS — Z7401 Bed confinement status: Secondary | ICD-10-CM | POA: Diagnosis not present

## 2018-08-31 DIAGNOSIS — S329XXA Fracture of unspecified parts of lumbosacral spine and pelvis, initial encounter for closed fracture: Secondary | ICD-10-CM | POA: Insufficient documentation

## 2018-08-31 DIAGNOSIS — S32592D Other specified fracture of left pubis, subsequent encounter for fracture with routine healing: Secondary | ICD-10-CM | POA: Diagnosis not present

## 2018-08-31 DIAGNOSIS — S3993XD Unspecified injury of pelvis, subsequent encounter: Secondary | ICD-10-CM | POA: Diagnosis not present

## 2018-08-31 DIAGNOSIS — Z20828 Contact with and (suspected) exposure to other viral communicable diseases: Secondary | ICD-10-CM | POA: Diagnosis not present

## 2018-08-31 DIAGNOSIS — I10 Essential (primary) hypertension: Secondary | ICD-10-CM | POA: Diagnosis not present

## 2018-08-31 LAB — GLUCOSE, CAPILLARY
Glucose-Capillary: 135 mg/dL — ABNORMAL HIGH (ref 70–99)
Glucose-Capillary: 137 mg/dL — ABNORMAL HIGH (ref 70–99)
Glucose-Capillary: 144 mg/dL — ABNORMAL HIGH (ref 70–99)
Glucose-Capillary: 173 mg/dL — ABNORMAL HIGH (ref 70–99)

## 2018-08-31 LAB — CBC
HCT: 26.4 % — ABNORMAL LOW (ref 36.0–46.0)
Hemoglobin: 8.6 g/dL — ABNORMAL LOW (ref 12.0–15.0)
MCH: 35.5 pg — ABNORMAL HIGH (ref 26.0–34.0)
MCHC: 32.6 g/dL (ref 30.0–36.0)
MCV: 109.1 fL — ABNORMAL HIGH (ref 80.0–100.0)
Platelets: 555 10*3/uL — ABNORMAL HIGH (ref 150–400)
RBC: 2.42 MIL/uL — ABNORMAL LOW (ref 3.87–5.11)
RDW: 21.9 % — ABNORMAL HIGH (ref 11.5–15.5)
WBC: 20.5 10*3/uL — ABNORMAL HIGH (ref 4.0–10.5)
nRBC: 0 % (ref 0.0–0.2)

## 2018-08-31 LAB — BASIC METABOLIC PANEL
Anion gap: 14 (ref 5–15)
BUN: 23 mg/dL (ref 8–23)
CO2: 22 mmol/L (ref 22–32)
Calcium: 7.9 mg/dL — ABNORMAL LOW (ref 8.9–10.3)
Chloride: 101 mmol/L (ref 98–111)
Creatinine, Ser: 1.64 mg/dL — ABNORMAL HIGH (ref 0.44–1.00)
GFR calc Af Amer: 31 mL/min — ABNORMAL LOW (ref >60)
GFR calc non Af Amer: 27 mL/min — ABNORMAL LOW (ref >60)
Glucose, Bld: 141 mg/dL — ABNORMAL HIGH (ref 70–99)
Potassium: 3.3 mmol/L — ABNORMAL LOW (ref 3.5–5.1)
Sodium: 137 mmol/L (ref 135–145)

## 2018-08-31 MED ORDER — TAMSULOSIN HCL 0.4 MG PO CAPS: 0.4000 mg | ORAL_CAPSULE | Freq: Every day | ORAL | Status: AC

## 2018-08-31 MED ORDER — POTASSIUM CHLORIDE CRYS ER 20 MEQ PO TBCR
40.0000 meq | EXTENDED_RELEASE_TABLET | Freq: Once | ORAL | Status: AC
  Administered 2018-08-31: 06:00:00 40 meq via ORAL
  Filled 2018-08-31: qty 2

## 2018-08-31 MED ORDER — MAGNESIUM SULFATE 2 GM/50ML IV SOLN
2.0000 g | Freq: Once | INTRAVENOUS | Status: AC
  Administered 2018-08-31: 2 g via INTRAVENOUS
  Filled 2018-08-31: qty 50

## 2018-08-31 MED ORDER — OXYCODONE HCL 5 MG PO TABS: 5.0000 mg | ORAL_TABLET | ORAL | 0 refills | Status: AC | PRN

## 2018-08-31 MED ORDER — DULOXETINE HCL 30 MG PO CPEP: 30.0000 mg | ORAL_CAPSULE | Freq: Every day | ORAL | 3 refills | Status: AC
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
## 2018-08-31 NOTE — Progress Notes (Signed)
PROGRESS CONSULT NOTE  Bridget Torres JOI:[id number] DOB: 09/02/1925 DOA: 08/27/2018 PCP: [name], MD       HPI/Recap of past 24 hours: 85 y.o. female with medical history significant of paroxysmal A.fib, anticoagulation on Eliquis, history of congestive heart failure, diabetes mellitus type 2, HTN, hyperthyroidism, dementia  TRH consulted  for medical management.  08/31/18: Patient was seen and examined at her bedside this morning.  No acute events overnight.  She denies any pain, not in distress.  Renal function is improving and hemoglobin is stable.  Hypokalemic this morning with potassium 3.3 which was repleted.  Leukocytosis is improving and trending down.  Likely reactive from recent trauma.  Procalcitonin essentially negative at 0.19, afebrile.  Primary team planning on discharging to SNF today.  We will sign off.  Recommend close follow-up with primary care provider outpatient to monitor renal function and anemia.     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 Procalcitonin unremarkable 0.19, afebrile  Improving AKI on CKD 3 likely multifactorial prerenal in the setting of dehydration  Improved after IV fluid hydration Creatinine 1.64 on 08/31/18 from 2.02 yesterday Creatinine at baseline 1.03 with GFR 47 Avoid dehydration and nephrotoxins Follow-up with your primary care provider posthospitalization.  Hypokalemia Potassium 3.3 Repleted with oral  KCl 40 mEq once Follow-up with PCP  Acute blood loss anemia in the setting of pelvic fracture post trauma Hemoglobin at baseline 13.6 on 08/27/18 Hemoglobin stable 8.6 on 08/31/2018 from 8.9 yesterday Iron studies no significant iron deficiency Follow-up with your primary care provider  Type 2 diabetes with hyperglycemia Last hemoglobin A1c 6.6 CBGs have been stable On glimepiride at home 1 mg daily Avoid hypoglycemia Follow-up with PCP  Dementia with no behavioral disturbance Reorient as needed Fall precautions  Elevated alkaline phosphatase likely secondary to recent trauma Likely related to her trauma  Hyperbilirubinemia Total bilirubin 1.4 Monitor repeat level No sign of overt bleeding at this time Follow-up with PCP  Thrombocytemia Presented with platelet greater than 700K >> platelet count 555 on 08/31/2018 Anticoagulant and antiplatelet on hold due to hematoma Defer to primary team to restart these 2. Hydroxyurea on hold which can worsen her anemia>>  Defer to primary care team to restart.  Pelvic hematoma in female -defer to primary care team to restart anticoagulation and antiplatelet. . Hyperthyroidism -continue methimazole  .  Questionable chronic systolic heart failure ([name]) -currently appears to be euvolemic.  BNP 217 on 08/30/2018. No record of recent 2D echo. . Paroxysmal atrial fibrillation (HCC) -         - CHA2DS2 vas score 4 :  Not on anticoagulation secondary to Risk of Falls  recurrent bleeding.  Defer to primary team to restart.  . Persistent Prolonged QT interval  QTC 526 personally reviewed, repeated twelve-lead EKG done on 08/29/2018 showed QTC 514 Continue to avoid QTC prolonging drugs.  Plan for DC to SNF today 08/31/18 per primary team. We will sign off. Thank you for allowing [name] to participate  in the care of this patient.    Objective: Vitals:   08/30/18 1922 08/30/18 2314 08/31/18 0436 08/31/18 0758  BP: (!) 129/47 (!) 137/53 (!) 128/54  (!) 128/54  Pulse: 98  98 96  Resp: 18 20 18    Temp: 97.7 F (36.5 C) 97.9 F (36.6 C) (!) 97.5 F (36.4 C) (!) 97.5 F (36.4 C)  TempSrc: Oral Oral Oral Oral  SpO2: 94% 94% 93% 93%  Weight:        Intake/Output Summary (Last 24 hours) at 08/31/2018 0801 Last data filed at 08/31/2018 2423 Gross per 24 hour  Intake 1300 ml  Output 400 ml  Net 900 ml   Filed Weights   08/27/18 2247  Weight: 49.9 kg    Exam:  . General: 85 y.o. year-old female well-developed well-nourished female no acute distress.  Alert in the setting of dementia. . Cardiovascular: Regular rate and rhythm no rubs or gallops no JVD or thyromegaly.   Marland Kitchen Respiratory: Clear to auscultation no wheezes or rales.  Poor inspiratory effort. . Abdomen: Soft nontender nondistended bowel sounds present . Musculoskeletal: Trace edema lower extremities bilaterally.  2 out of 4 pulses in all 4 extremities . Psychiatry: Mood appropriate for condition   Data Reviewed: CBC: Recent Labs  Lab 08/27/18 1533 08/28/18 0023 08/29/18 0412 08/30/18 0305 08/31/18 0337  WBC 30.5* 29.0* 28.5* 22.5* 20.5*  NEUTROABS 28.7*  --   --  19.0*  --   HGB 13.6 11.7* 10.6* 8.9* 8.6*  HCT 41.5 36.4 33.1* 28.4* 26.4*  MCV 106.1* 107.7* 110.0* 111.8* 109.1*  PLT 782* 745* 614* 578* 536*   Basic Metabolic Panel: Recent Labs  Lab 08/27/18 1533 08/28/18 0023 08/29/18 0412 08/30/18 0700 08/31/18 0337  NA 139 140 140 138 137  K 3.9 3.8 3.5 3.7 3.3*  CL 102 103 103 104 101  CO2 26 28 25 22 22   GLUCOSE 141* 168* 167* 144* 141*  BUN 11 12 21  25* 23  CREATININE 1.03* 1.06* 1.83* 2.02* 1.64*  CALCIUM 8.7* 8.4* 8.2* 7.8* 7.9*   GFR: CrCl cannot be calculated (Unknown ideal weight.). Liver Function Tests: Recent Labs  Lab 08/27/18 1533  AST 26  ALT 12  ALKPHOS 159*  BILITOT 1.4*  PROT 5.8*  ALBUMIN 2.8*   No results for input(s): LIPASE, AMYLASE in the last 168 hours. No results for input(s): AMMONIA in the last 168 hours.  Coagulation Profile: Recent Labs  Lab 08/27/18 1533  INR 1.2   Cardiac Enzymes: No results for input(s): CKTOTAL, CKMB, CKMBINDEX, TROPONINI in the last 168 hours. BNP (last 3 results) No results for input(s): PROBNP in the last 8760 hours. HbA1C: No results for input(s): HGBA1C in the last 72 hours. CBG: Recent Labs  Lab 08/30/18 1206 08/30/18 1612 08/30/18 2022 08/31/18 0032 08/31/18 0422  GLUCAP 121* 107* 130* 144* 137*   Lipid Profile: No results for input(s): CHOL, HDL, LDLCALC, TRIG, CHOLHDL, LDLDIRECT in the last 72 hours. Thyroid Function Tests: No results for input(s): TSH, T4TOTAL, FREET4, T3FREE, THYROIDAB in the last 72 hours. Anemia Panel: Recent Labs    08/30/18 1141  FERRITIN 178  TIBC 216*  IRON 81  RETICCTPCT 5.7*   Urine analysis:    Component Value Date/Time   COLORURINE YELLOW 08/28/2018 0704   APPEARANCEUR CLEAR 08/28/2018 0704   LABSPEC >1.046 (H) 08/28/2018 0704   PHURINE 5.0 08/28/2018 0704   GLUCOSEU NEGATIVE 08/28/2018 0704   HGBUR NEGATIVE 08/28/2018 [place] NEGATIVE 08/28/2018 0704  KETONESUR 5 (A) 08/28/2018 0704   PROTEINUR NEGATIVE 08/28/2018 0704   NITRITE NEGATIVE 08/28/2018 0704   LEUKOCYTESUR NEGATIVE 08/28/2018 0704   Sepsis Labs: @LABRCNTIP (procalcitonin:4,lacticidven:4)  ) Recent Results (from the past 240 hour(s))  SARS Coronavirus 2 (CEPHEID - Performed in [place] hospital lab), Hosp Order     Status: None   Collection Time: 08/27/18  6:29 PM   Specimen: Nasopharyngeal Swab  Result Value Ref Range Status   SARS Coronavirus 2 NEGATIVE NEGATIVE Final    Comment: (NOTE) If result is NEGATIVE SARS-CoV-2 target nucleic acids are NOT DETECTED. The SARS-CoV-2 RNA is generally detectable in upper and lower  respiratory specimens during the acute phase of infection. The lowest  concentration of SARS-CoV-2 viral copies this assay can detect is 250  copies / mL. A negative result does not preclude  SARS-CoV-2 infection  and should not be used as the sole basis for treatment or other  patient management decisions.  A negative result may occur with  improper specimen collection / handling, submission of specimen other  than nasopharyngeal swab, presence of viral mutation(s) within the  areas targeted by this assay, and inadequate number of viral copies  (<250 copies / mL). A negative result must be combined with clinical  observations, patient history, and epidemiological information. If result is POSITIVE SARS-CoV-2 target nucleic acids are DETECTED. The SARS-CoV-2 RNA is generally detectable in upper and lower  respiratory specimens dur ing the acute phase of infection.  Positive  results are indicative of active infection with SARS-CoV-2.  Clinical  correlation with patient history and other diagnostic information is  necessary to determine patient infection status.  Positive results do  not rule out bacterial infection or co-infection with other viruses. If result is PRESUMPTIVE POSTIVE SARS-CoV-2 nucleic acids MAY BE PRESENT.   A presumptive positive result was obtained on the submitted specimen  and confirmed on repeat testing.  While 2019 novel coronavirus  (SARS-CoV-2) nucleic acids may be present in the submitted sample  additional confirmatory testing may be necessary for epidemiological  and / or clinical management purposes  to differentiate between  SARS-CoV-2 and other Sarbecovirus currently known to infect humans.  If clinically indicated additional testing with an alternate test  methodology [phone]) is advised. The SARS-CoV-2 RNA is generally  detectable in upper and lower respiratory sp ecimens during the acute  phase of infection. The expected result is Negative. Fact Sheet for Patients:  StrictlyIdeas.no Fact Sheet for Healthcare Providers: BankingDealers.co.za This test is not yet approved or cleared by the  [place] FDA and has been authorized for detection and/or diagnosis of SARS-CoV-2 by FDA under an Emergency Use Authorization (EUA).  This EUA will remain in effect (meaning this test can be used) for the duration of the COVID-19 declaration under Section 564(b)(1) of the Act, 21 U.S.C. section 360bbb-3(b)(1), unless the authorization is terminated or revoked sooner. Performed at [facility], [place] [address] [place], [place] [address]   MRSA PCR Screening     Status: None   Collection Time: 08/28/18  4:17 AM   Specimen: Nasal Mucosa; Nasopharyngeal  Result Value Ref Range Status   MRSA by PCR NEGATIVE NEGATIVE Final    Comment:        The GeneXpert MRSA Assay (FDA approved for NASAL specimens only), is one component of a comprehensive MRSA colonization surveillance program. It is not intended to diagnose MRSA infection nor to guide or monitor treatment for MRSA infections. Performed at [facility], [place] [address], [place], [place] [address]       Studies: No results found.  Scheduled Meds: . amiodarone  200 mg Oral Daily  . bethanechol  25 mg Oral TID  . calcium-vitamin D  1 tablet Oral Q breakfast  . docusate sodium  100 mg Oral BID  . ferrous sulfate  325 mg Oral Q breakfast  . insulin aspart  0-9 Units Subcutaneous Q4H  . metoprolol succinate  12.5 mg Oral Daily  . tamsulosin  0.4 mg Oral QPC supper    Continuous Infusions: . lactated ringers 75 mL/hr at 08/31/18 4196     LOS: 4 days     Kayleen Memos, MD [facility] Pager [phone]  If 7PM-7AM, please contact night-coverage [email or web address] Password [email or web address] 08/31/2018, 8:01 AM

## 2018-08-31 NOTE — Discharge Instructions (Signed)
Would not restart eliquis due to high risk of bleeding and falls  weightbaring as tolerated on both lower extremities
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
## 2018-09-01 DIAGNOSIS — E119 Type 2 diabetes mellitus without complications: Secondary | ICD-10-CM | POA: Diagnosis not present

## 2018-09-01 DIAGNOSIS — D471 Chronic myeloproliferative disease: Secondary | ICD-10-CM | POA: Diagnosis not present

## 2018-09-01 DIAGNOSIS — S3289XD Fracture of other parts of pelvis, subsequent encounter for fracture with routine healing: Secondary | ICD-10-CM | POA: Diagnosis not present

## 2018-09-01 DIAGNOSIS — I10 Essential (primary) hypertension: Secondary | ICD-10-CM | POA: Diagnosis not present

## 2018-09-03 ENCOUNTER — Other Ambulatory Visit: Payer: Self-pay

## 2018-09-13 DIAGNOSIS — D471 Chronic myeloproliferative disease: Secondary | ICD-10-CM | POA: Diagnosis not present

## 2018-09-13 DIAGNOSIS — I10 Essential (primary) hypertension: Secondary | ICD-10-CM | POA: Diagnosis not present

## 2018-09-13 DIAGNOSIS — E119 Type 2 diabetes mellitus without complications: Secondary | ICD-10-CM | POA: Diagnosis not present

## 2018-09-13 DIAGNOSIS — R634 Abnormal weight loss: Secondary | ICD-10-CM | POA: Diagnosis not present

## 2018-10-07 DIAGNOSIS — I10 Essential (primary) hypertension: Secondary | ICD-10-CM | POA: Diagnosis not present

## 2018-10-07 DIAGNOSIS — E119 Type 2 diabetes mellitus without complications: Secondary | ICD-10-CM | POA: Diagnosis not present

## 2018-10-07 DIAGNOSIS — D471 Chronic myeloproliferative disease: Secondary | ICD-10-CM | POA: Diagnosis not present

## 2018-10-07 DIAGNOSIS — R634 Abnormal weight loss: Secondary | ICD-10-CM | POA: Diagnosis not present

## 2018-10-09 DIAGNOSIS — S329XXD Fracture of unspecified parts of lumbosacral spine and pelvis, subsequent encounter for fracture with routine healing: Secondary | ICD-10-CM | POA: Diagnosis not present

## 2018-10-20 DIAGNOSIS — D471 Chronic myeloproliferative disease: Secondary | ICD-10-CM | POA: Diagnosis not present

## 2018-10-20 DIAGNOSIS — E119 Type 2 diabetes mellitus without complications: Secondary | ICD-10-CM | POA: Diagnosis not present

## 2018-10-20 DIAGNOSIS — R634 Abnormal weight loss: Secondary | ICD-10-CM | POA: Diagnosis not present

## 2018-10-20 DIAGNOSIS — I10 Essential (primary) hypertension: Secondary | ICD-10-CM | POA: Diagnosis not present

## 2018-10-24 DIAGNOSIS — I10 Essential (primary) hypertension: Secondary | ICD-10-CM | POA: Diagnosis not present

## 2018-10-24 DIAGNOSIS — D471 Chronic myeloproliferative disease: Secondary | ICD-10-CM | POA: Diagnosis not present

## 2018-10-24 DIAGNOSIS — R634 Abnormal weight loss: Secondary | ICD-10-CM | POA: Diagnosis not present

## 2018-10-24 DIAGNOSIS — E119 Type 2 diabetes mellitus without complications: Secondary | ICD-10-CM | POA: Diagnosis not present

## 2018-10-30 DIAGNOSIS — E119 Type 2 diabetes mellitus without complications: Secondary | ICD-10-CM | POA: Diagnosis not present

## 2018-10-30 DIAGNOSIS — I10 Essential (primary) hypertension: Secondary | ICD-10-CM | POA: Diagnosis not present

## 2018-10-30 DIAGNOSIS — D529 Folate deficiency anemia, unspecified: Secondary | ICD-10-CM | POA: Diagnosis not present

## 2018-10-30 DIAGNOSIS — D471 Chronic myeloproliferative disease: Secondary | ICD-10-CM | POA: Diagnosis not present

## 2018-11-07 DIAGNOSIS — Z23 Encounter for immunization: Secondary | ICD-10-CM | POA: Diagnosis not present

## 2018-11-14 DIAGNOSIS — Z20828 Contact with and (suspected) exposure to other viral communicable diseases: Secondary | ICD-10-CM | POA: Diagnosis not present

## 2018-11-17 DIAGNOSIS — E119 Type 2 diabetes mellitus without complications: Secondary | ICD-10-CM | POA: Diagnosis not present

## 2018-11-17 DIAGNOSIS — D471 Chronic myeloproliferative disease: Secondary | ICD-10-CM | POA: Diagnosis not present

## 2018-11-17 DIAGNOSIS — D529 Folate deficiency anemia, unspecified: Secondary | ICD-10-CM | POA: Diagnosis not present

## 2018-11-17 DIAGNOSIS — I10 Essential (primary) hypertension: Secondary | ICD-10-CM | POA: Diagnosis not present

## 2018-11-21 DIAGNOSIS — Z20828 Contact with and (suspected) exposure to other viral communicable diseases: Secondary | ICD-10-CM | POA: Diagnosis not present

## 2018-11-27 DIAGNOSIS — M6281 Muscle weakness (generalized): Secondary | ICD-10-CM | POA: Diagnosis not present

## 2018-11-27 DIAGNOSIS — R2681 Unsteadiness on feet: Secondary | ICD-10-CM | POA: Diagnosis not present

## 2018-11-28 DIAGNOSIS — M6281 Muscle weakness (generalized): Secondary | ICD-10-CM | POA: Diagnosis not present

## 2018-11-28 DIAGNOSIS — R2681 Unsteadiness on feet: Secondary | ICD-10-CM | POA: Diagnosis not present

## 2018-11-28 DIAGNOSIS — Z20828 Contact with and (suspected) exposure to other viral communicable diseases: Secondary | ICD-10-CM | POA: Diagnosis not present

## 2018-11-30 DIAGNOSIS — D529 Folate deficiency anemia, unspecified: Secondary | ICD-10-CM | POA: Diagnosis not present

## 2018-11-30 DIAGNOSIS — D471 Chronic myeloproliferative disease: Secondary | ICD-10-CM | POA: Diagnosis not present

## 2018-11-30 DIAGNOSIS — R2681 Unsteadiness on feet: Secondary | ICD-10-CM | POA: Diagnosis not present

## 2018-11-30 DIAGNOSIS — M6281 Muscle weakness (generalized): Secondary | ICD-10-CM | POA: Diagnosis not present

## 2018-11-30 DIAGNOSIS — E119 Type 2 diabetes mellitus without complications: Secondary | ICD-10-CM | POA: Diagnosis not present

## 2018-11-30 DIAGNOSIS — I10 Essential (primary) hypertension: Secondary | ICD-10-CM | POA: Diagnosis not present

## 2018-12-03 DIAGNOSIS — D649 Anemia, unspecified: Secondary | ICD-10-CM | POA: Diagnosis not present

## 2018-12-03 DIAGNOSIS — I130 Hypertensive heart and chronic kidney disease with heart failure and stage 1 through stage 4 chronic kidney disease, or unspecified chronic kidney disease: Secondary | ICD-10-CM | POA: Diagnosis not present

## 2018-12-03 DIAGNOSIS — M6281 Muscle weakness (generalized): Secondary | ICD-10-CM | POA: Diagnosis not present

## 2018-12-03 DIAGNOSIS — E119 Type 2 diabetes mellitus without complications: Secondary | ICD-10-CM | POA: Diagnosis not present

## 2018-12-03 DIAGNOSIS — E039 Hypothyroidism, unspecified: Secondary | ICD-10-CM | POA: Diagnosis not present

## 2018-12-03 DIAGNOSIS — R2681 Unsteadiness on feet: Secondary | ICD-10-CM | POA: Diagnosis not present

## 2018-12-04 DIAGNOSIS — M6281 Muscle weakness (generalized): Secondary | ICD-10-CM | POA: Diagnosis not present

## 2018-12-04 DIAGNOSIS — R2681 Unsteadiness on feet: Secondary | ICD-10-CM | POA: Diagnosis not present

## 2018-12-05 DIAGNOSIS — M6281 Muscle weakness (generalized): Secondary | ICD-10-CM | POA: Diagnosis not present

## 2018-12-05 DIAGNOSIS — R2681 Unsteadiness on feet: Secondary | ICD-10-CM | POA: Diagnosis not present

## 2018-12-05 DIAGNOSIS — Z20828 Contact with and (suspected) exposure to other viral communicable diseases: Secondary | ICD-10-CM | POA: Diagnosis not present

## 2018-12-06 DIAGNOSIS — R2681 Unsteadiness on feet: Secondary | ICD-10-CM | POA: Diagnosis not present

## 2018-12-06 DIAGNOSIS — M6281 Muscle weakness (generalized): Secondary | ICD-10-CM | POA: Diagnosis not present

## 2018-12-09 DIAGNOSIS — D471 Chronic myeloproliferative disease: Secondary | ICD-10-CM | POA: Diagnosis not present

## 2018-12-09 DIAGNOSIS — I10 Essential (primary) hypertension: Secondary | ICD-10-CM | POA: Diagnosis not present

## 2018-12-09 DIAGNOSIS — E119 Type 2 diabetes mellitus without complications: Secondary | ICD-10-CM | POA: Diagnosis not present

## 2018-12-09 DIAGNOSIS — D529 Folate deficiency anemia, unspecified: Secondary | ICD-10-CM | POA: Diagnosis not present

## 2018-12-26 DIAGNOSIS — Z20828 Contact with and (suspected) exposure to other viral communicable diseases: Secondary | ICD-10-CM | POA: Diagnosis not present

## 2018-12-31 DIAGNOSIS — E039 Hypothyroidism, unspecified: Secondary | ICD-10-CM | POA: Diagnosis not present

## 2018-12-31 DIAGNOSIS — D649 Anemia, unspecified: Secondary | ICD-10-CM | POA: Diagnosis not present

## 2018-12-31 DIAGNOSIS — I1310 Hypertensive heart and chronic kidney disease without heart failure, with stage 1 through stage 4 chronic kidney disease, or unspecified chronic kidney disease: Secondary | ICD-10-CM | POA: Diagnosis not present

## 2019-01-01 DIAGNOSIS — S329XXD Fracture of unspecified parts of lumbosacral spine and pelvis, subsequent encounter for fracture with routine healing: Secondary | ICD-10-CM | POA: Diagnosis not present

## 2019-01-02 DIAGNOSIS — Z20828 Contact with and (suspected) exposure to other viral communicable diseases: Secondary | ICD-10-CM | POA: Diagnosis not present

## 2019-01-06 DIAGNOSIS — I10 Essential (primary) hypertension: Secondary | ICD-10-CM | POA: Diagnosis not present

## 2019-01-06 DIAGNOSIS — D471 Chronic myeloproliferative disease: Secondary | ICD-10-CM | POA: Diagnosis not present

## 2019-01-06 DIAGNOSIS — D529 Folate deficiency anemia, unspecified: Secondary | ICD-10-CM | POA: Diagnosis not present

## 2019-01-06 DIAGNOSIS — E119 Type 2 diabetes mellitus without complications: Secondary | ICD-10-CM | POA: Diagnosis not present

## 2019-01-07 DIAGNOSIS — Z20828 Contact with and (suspected) exposure to other viral communicable diseases: Secondary | ICD-10-CM | POA: Diagnosis not present

## 2019-01-11 DIAGNOSIS — R0989 Other specified symptoms and signs involving the circulatory and respiratory systems: Secondary | ICD-10-CM | POA: Diagnosis not present

## 2019-01-11 DIAGNOSIS — R1310 Dysphagia, unspecified: Secondary | ICD-10-CM | POA: Diagnosis not present

## 2019-01-11 DIAGNOSIS — R279 Unspecified lack of coordination: Secondary | ICD-10-CM | POA: Diagnosis not present

## 2019-01-11 DIAGNOSIS — R0902 Hypoxemia: Secondary | ICD-10-CM | POA: Diagnosis not present

## 2019-01-11 DIAGNOSIS — Z743 Need for continuous supervision: Secondary | ICD-10-CM | POA: Diagnosis not present

## 2019-01-14 DIAGNOSIS — Z20828 Contact with and (suspected) exposure to other viral communicable diseases: Secondary | ICD-10-CM | POA: Diagnosis not present

## 2019-01-16 DIAGNOSIS — R1312 Dysphagia, oropharyngeal phase: Secondary | ICD-10-CM | POA: Diagnosis not present

## 2019-01-28 DIAGNOSIS — E039 Hypothyroidism, unspecified: Secondary | ICD-10-CM | POA: Diagnosis not present

## 2019-01-28 DIAGNOSIS — I10 Essential (primary) hypertension: Secondary | ICD-10-CM | POA: Diagnosis not present

## 2019-01-28 DIAGNOSIS — E1143 Type 2 diabetes mellitus with diabetic autonomic (poly)neuropathy: Secondary | ICD-10-CM | POA: Diagnosis not present

## 2019-03-04 DEATH — deceased

## 2020-03-03 DEATH — deceased
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
## 2020-08-30 IMAGING — DX PORTABLE CHEST - 1 VIEW
1 series · 1 of 1 positions shown · non-contrast
Comparison: August 21, 2018

CLINICAL DATA: Pain following fall

EXAM:
PORTABLE CHEST 1 VIEW

[chest ap]
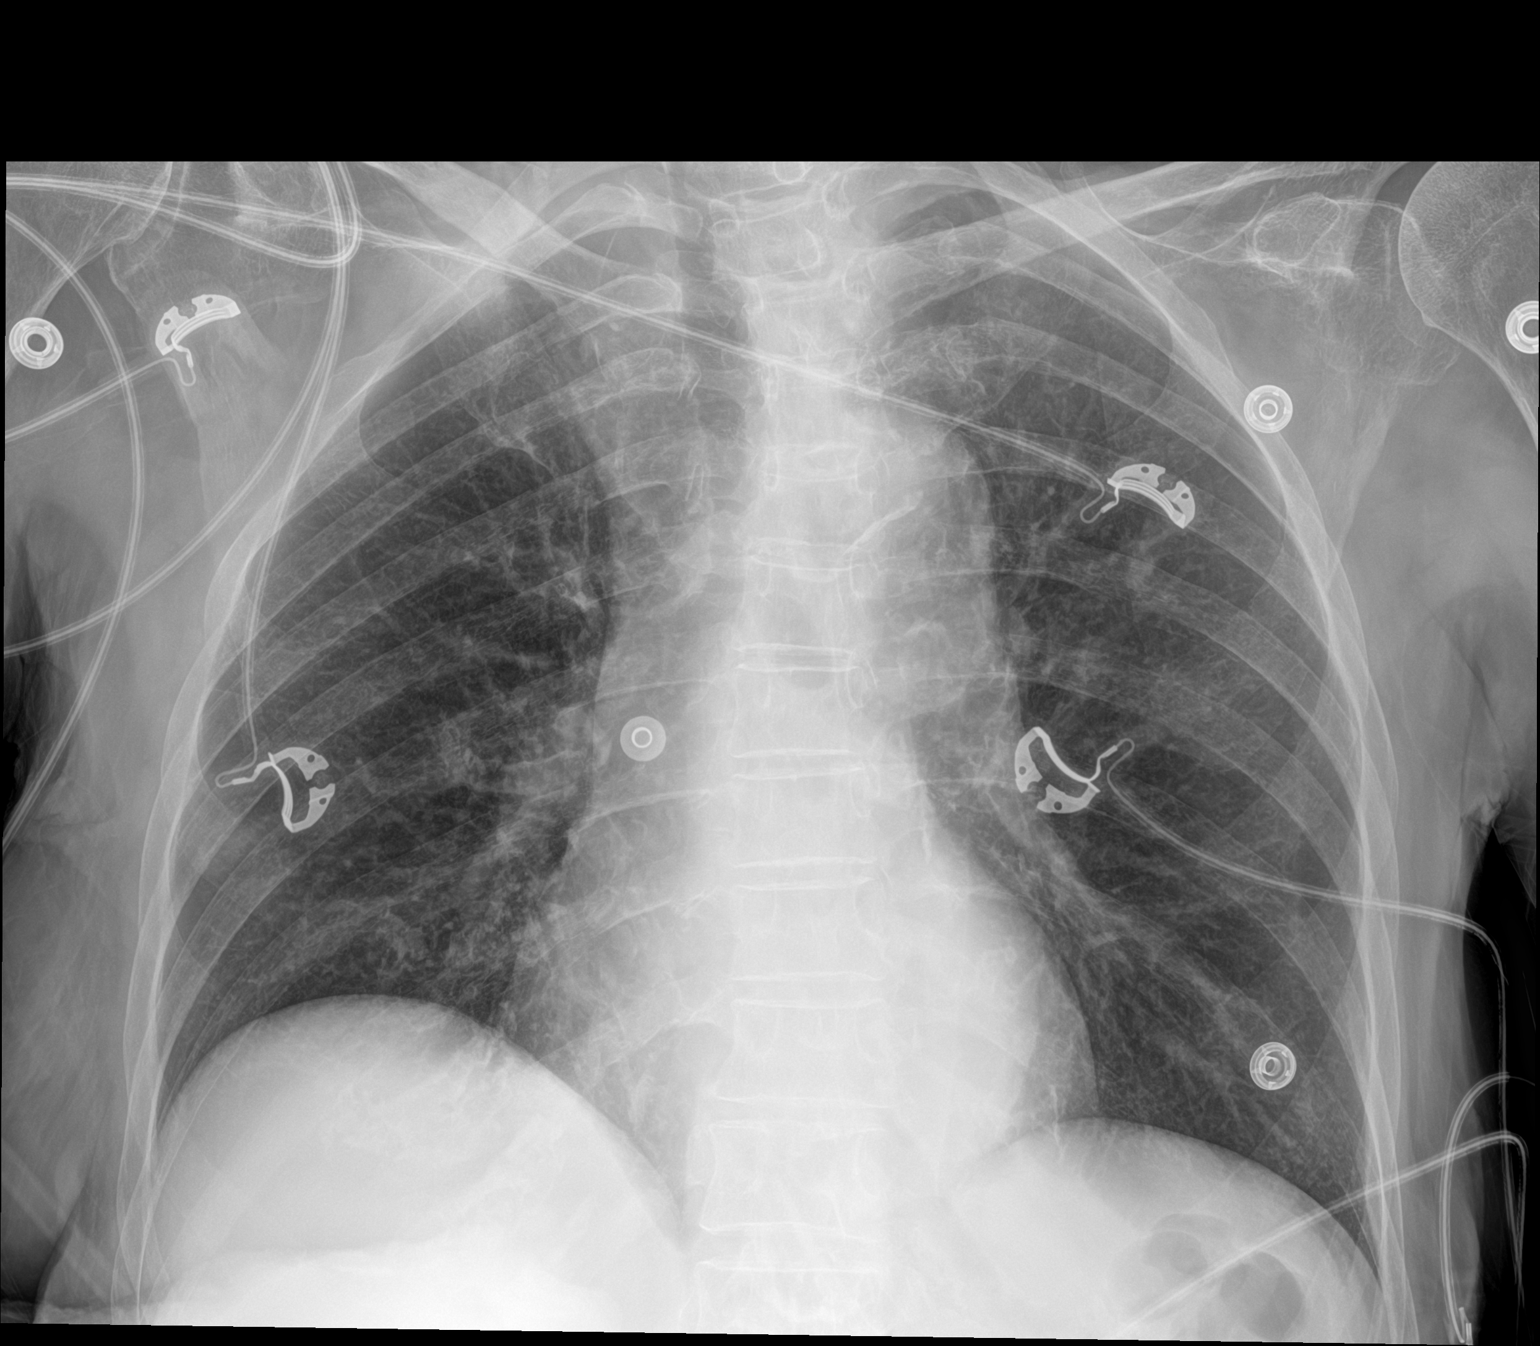

[1 of 1 positions shown; findings below may reference images not displayed]

FINDINGS: No edema or consolidation. Heart size and pulmonary vascularity are
normal. There is aortic atherosclerosis. No pneumothorax. No bone
lesions evident.
IMPRESSION: No edema or consolidation. No pneumothorax. Heart size within normal
limits. Aortic Atherosclerosis ([id number]).

## 2020-08-30 IMAGING — DX PORTABLE PELVIS 1-2 VIEWS
1 series · 1 of 1 positions shown · non-contrast
Comparison: April 14, 2017

CLINICAL DATA: Pain following fall

EXAM:
PORTABLE PELVIS 1-2 VIEWS

[pelvis ap]
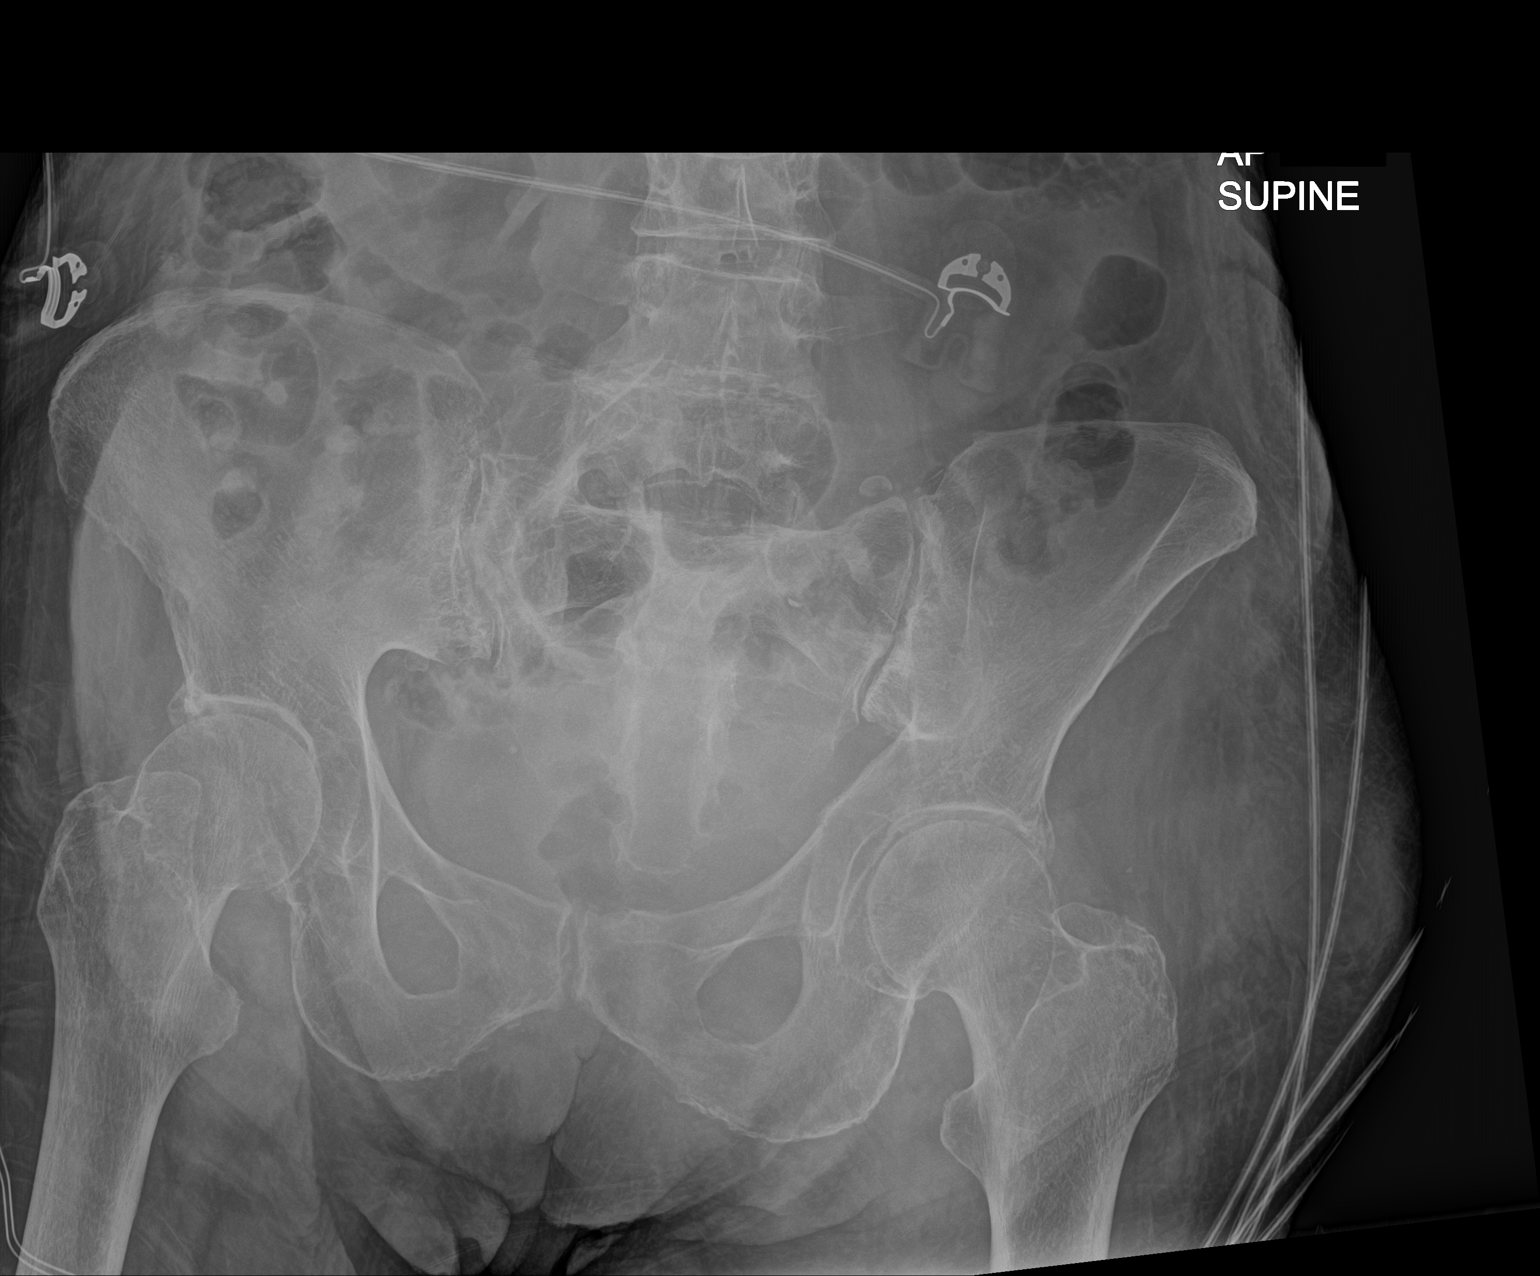

[1 of 1 positions shown; findings below may reference images not displayed]

FINDINGS: No fracture or dislocation evident. There is moderate narrowing of
each hip joint. No erosive change.
IMPRESSION: Moderate symmetric narrowing of each hip joint. No fracture or
dislocation.
